# Patient Record
Sex: Female | Born: 1990 | Race: Black or African American | Hispanic: No | Marital: Single | Smoking: Current some day smoker
Health system: Southern US, Community
[De-identification: ages and names within clinical notes are randomized; demographics above are authoritative.]

## PROBLEM LIST (undated history)

## (undated) ENCOUNTER — Inpatient Hospital Stay (HOSPITAL_COMMUNITY): Payer: Self-pay

## (undated) ENCOUNTER — Ambulatory Visit

## (undated) DIAGNOSIS — Z789 Other specified health status: Secondary | ICD-10-CM

## (undated) HISTORY — DX: Other specified health status: Z78.9

## (undated) HISTORY — PX: NO PAST SURGERIES: SHX2092

---
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
## 2012-02-06 ENCOUNTER — Inpatient Hospital Stay (HOSPITAL_COMMUNITY)
Admission: AD | Admit: 2012-02-06 | Discharge: 2012-02-06 | Disposition: A | Discharge status: Home / Self Care | Payer: Self-pay | Source: Ambulatory Visit | Attending: Obstetrics and Gynecology | Admitting: Obstetrics and Gynecology

## 2012-02-06 ENCOUNTER — Encounter (HOSPITAL_COMMUNITY): Payer: Self-pay

## 2012-02-06 DIAGNOSIS — O99891 Other specified diseases and conditions complicating pregnancy: Secondary | ICD-10-CM | POA: Insufficient documentation

## 2012-02-06 DIAGNOSIS — O210 Mild hyperemesis gravidarum: Secondary | ICD-10-CM | POA: Insufficient documentation

## 2012-02-06 DIAGNOSIS — H539 Unspecified visual disturbance: Secondary | ICD-10-CM | POA: Insufficient documentation

## 2012-02-06 DIAGNOSIS — O0930 Supervision of pregnancy with insufficient antenatal care, unspecified trimester: Secondary | ICD-10-CM | POA: Insufficient documentation

## 2012-02-06 DIAGNOSIS — R42 Dizziness and giddiness: Secondary | ICD-10-CM | POA: Insufficient documentation

## 2012-02-06 LAB — CBC
MCH: 31.3 pg (ref 26.0–34.0)
MCHC: 33.9 g/dL (ref 30.0–36.0)
MCV: 92.2 fL (ref 78.0–100.0)
Platelets: 196 10*3/uL (ref 150–400)
RBC: 3.58 MIL/uL — ABNORMAL LOW (ref 3.87–5.11)
RDW: 13.4 % (ref 11.5–15.5)

## 2012-02-06 LAB — URINALYSIS, ROUTINE W REFLEX MICROSCOPIC
Bilirubin Urine: NEGATIVE
Hgb urine dipstick: NEGATIVE
Ketones, ur: 15 mg/dL — AB
Protein, ur: NEGATIVE mg/dL
Urobilinogen, UA: 0.2 mg/dL (ref 0.0–1.0)

## 2012-02-06 MED ORDER — CONCEPT OB 130-92.4-1 MG PO CAPS: 1.0000 | ORAL_CAPSULE | Freq: Every day | ORAL | Status: DC

## 2012-02-06 NOTE — MAU Provider Note (Signed)
Chief Complaint: Dizziness   First Provider Initiated Contact with Patient 02/06/12 1201     SUBJECTIVE HPI: Tracy Church is a 21 y.o. G1P0 at [redacted]w[redacted]d by LMP who presents with dizziness and seeing black spots, x 1 week and occassional heartburn at HS. No Sx now. Mild N/V this pregnancy. Has not started [facility]. Medicaid pending. Denies VB, abd pain, vaginal discharge, palpitations. Tolerating POs.   Past Medical History  Diagnosis Date  . Heart murmur   . Urinary tract infection    OB History    Grav Para Term Preterm Abortions TAB SAB Ect Mult Living   1              # Outc Date GA Lbr Len/2nd Wgt Sex Del Anes PTL Lv   1 CUR              Past Surgical History  Procedure Date  . No past surgeries    History   Social History  . Marital Status: Single    Spouse Name: N/A    Number of Children: N/A  . Years of Education: N/A   Occupational History  . Not on file.   Social History Main Topics  . Smoking status: Former Games developer  . Smokeless tobacco: Never Used     Comment: prior to preg  . Alcohol Use: No  . Drug Use: No  . Sexually Active: Not Currently   Other Topics Concern  . Not on file   Social History Narrative  . No narrative on file   No current facility-administered medications on file prior to encounter.   No current outpatient prescriptions on file prior to encounter.   No Known Allergies  ROS: Pertinent items in HPI  OBJECTIVE Blood pressure 115/53, pulse 81, temperature 98.1 F (36.7 C), temperature source Oral, resp. rate 16, height 5\' 7"  (1.702 m), weight 57.153 kg (126 lb), last menstrual period 09/29/2011. GENERAL: Well-developed, well-nourished female in no acute distress. No cyanosis. Ambulating w/out difficulty.  HEENT: Normocephalic HEART: normal rate RESP: normal effort ABDOMEN: Soft, non-tender EXTREMITIES: Nontender, no edema NEURO: Alert and oriented SPECULUM EXAM: NEFG, Deferred FHR: 150  LAB RESULTS Results for orders  placed during the hospital encounter of 02/06/12 (from the past 24 hour(s))  URINALYSIS, ROUTINE W REFLEX MICROSCOPIC     Status: Abnormal   Collection Time   02/06/12 10:55 AM      Component Value Range   Color, Urine YELLOW  YELLOW   APPearance CLEAR  CLEAR   Specific Gravity, Urine 1.020  1.005 - 1.030   pH 7.0  5.0 - 8.0   Glucose, UA NEGATIVE  NEGATIVE mg/dL   Hgb urine dipstick NEGATIVE  NEGATIVE   Bilirubin Urine NEGATIVE  NEGATIVE   Ketones, ur 15 (*) NEGATIVE mg/dL   Protein, ur NEGATIVE  NEGATIVE mg/dL   Urobilinogen, UA 0.2  0.0 - 1.0 mg/dL   Nitrite NEGATIVE  NEGATIVE   Leukocytes, UA NEGATIVE  NEGATIVE  GLUCOSE, CAPILLARY     Status: Normal   Collection Time   02/06/12 12:18 PM      Component Value Range   Glucose-Capillary 93  70 - 99 mg/dL   Comment 1 Notify RN    CBC     Status: Abnormal   Collection Time   02/06/12 12:40 PM      Component Value Range   WBC 7.3  4.0 - 10.5 K/uL   RBC 3.58 (*) 3.87 - 5.11 MIL/uL   Hemoglobin 11.2 (*)  12.0 - 15.0 g/dL   HCT 33.2 (*) 95.1 - 88.4 %   MCV 92.2  78.0 - 100.0 fL   MCH 31.3  26.0 - 34.0 pg   MCHC 33.9  30.0 - 36.0 g/dL   RDW 16.6  06.3 - 01.6 %   Platelets 196  150 - 400 K/uL    IMAGING No results found.  MAU COURSE No dizziness during MAU visit.   ASSESSMENT 1. Dizziness   2. Insufficient prenatal care    PLAN Discharge home Encouraged small, frequent snacks and increasing fluids.     Follow-up Information    Follow up with [facility]. (will call you to schedule prenatal appointment)    Contact information:   [address] [place] [address] [phone]      Follow up with [facility]ERN[facility] ADMISS[facility]. (As needed if symptoms worsen)    Contact information:   [address] [id number] mc [place] [address] [phone]          Medication List     As of 02/06/2012  6:54 PM    STOP taking these medications          prenatal multivitamin Tabs      TAKE these medications         CONCEPT OB 130-92.4-1 MG Caps   Take 1 tablet by mouth daily.        [place], CNM 02/06/2012  11:57 AM

## 2012-02-06 NOTE — MAU Note (Signed)
Has been feeling dizzy spotting on 09/29/11 had morning sickness earlier none at present, positive pregnancy test in [facility].

## 2012-02-06 NOTE — MAU Note (Signed)
Feeling dizzy and seeing black spots, started about a wk ago.  Sometimes feels chest pain- heartburn,usually before she goes to sleep, none now.

## 2012-02-07 ENCOUNTER — Encounter: Payer: Self-pay | Admitting: Obstetrics & Gynecology

## 2012-02-08 NOTE — MAU Provider Note (Signed)
Attestation of Attending Supervision of Advanced Practitioner (CNM/NP): Evaluation and management procedures were performed by the Advanced Practitioner under my supervision and collaboration.  I have reviewed the Advanced Practitioner's note and chart, and I agree with the management and plan.  Mareta Chesnut 02/08/2012 8:53 AM

## 2012-02-12 ENCOUNTER — Ambulatory Visit (HOSPITAL_COMMUNITY)
Admission: RE | Admit: 2012-02-12 | Discharge: 2012-02-12 | Disposition: A | Discharge status: Home / Self Care | Payer: Self-pay | Source: Ambulatory Visit | Attending: Advanced Practice Midwife | Admitting: Advanced Practice Midwife

## 2012-02-12 DIAGNOSIS — Z1389 Encounter for screening for other disorder: Secondary | ICD-10-CM | POA: Insufficient documentation

## 2012-02-12 DIAGNOSIS — O0930 Supervision of pregnancy with insufficient antenatal care, unspecified trimester: Secondary | ICD-10-CM | POA: Insufficient documentation

## 2012-02-12 DIAGNOSIS — Z363 Encounter for antenatal screening for malformations: Secondary | ICD-10-CM | POA: Insufficient documentation

## 2012-02-12 DIAGNOSIS — O358XX0 Maternal care for other (suspected) fetal abnormality and damage, not applicable or unspecified: Secondary | ICD-10-CM | POA: Insufficient documentation

## 2012-02-14 ENCOUNTER — Encounter (HOSPITAL_COMMUNITY): Payer: Self-pay | Admitting: Advanced Practice Midwife

## 2012-02-14 ENCOUNTER — Encounter: Payer: Self-pay | Admitting: Advanced Practice Midwife

## 2012-02-14 DIAGNOSIS — O4440 Low lying placenta NOS or without hemorrhage, unspecified trimester: Secondary | ICD-10-CM | POA: Insufficient documentation

## 2012-02-14 DIAGNOSIS — O43899 Other placental disorders, unspecified trimester: Secondary | ICD-10-CM | POA: Insufficient documentation

## 2012-03-11 ENCOUNTER — Other Ambulatory Visit: Payer: Self-pay | Admitting: Obstetrics & Gynecology

## 2012-03-11 ENCOUNTER — Ambulatory Visit (INDEPENDENT_AMBULATORY_CARE_PROVIDER_SITE_OTHER): Payer: Self-pay | Admitting: Obstetrics & Gynecology

## 2012-03-11 ENCOUNTER — Encounter: Payer: Self-pay | Admitting: Obstetrics & Gynecology

## 2012-03-11 VITALS — BP 119/64 | Temp 97.2°F | Wt 130.6 lb

## 2012-03-11 DIAGNOSIS — O4440 Low lying placenta NOS or without hemorrhage, unspecified trimester: Secondary | ICD-10-CM

## 2012-03-11 DIAGNOSIS — O0930 Supervision of pregnancy with insufficient antenatal care, unspecified trimester: Secondary | ICD-10-CM

## 2012-03-11 DIAGNOSIS — O4400 Placenta previa specified as without hemorrhage, unspecified trimester: Secondary | ICD-10-CM

## 2012-03-11 DIAGNOSIS — O4410 Placenta previa with hemorrhage, unspecified trimester: Secondary | ICD-10-CM

## 2012-03-11 DIAGNOSIS — O43899 Other placental disorders, unspecified trimester: Secondary | ICD-10-CM

## 2012-03-11 DIAGNOSIS — O99891 Other specified diseases and conditions complicating pregnancy: Secondary | ICD-10-CM

## 2012-03-11 LAB — POCT URINALYSIS DIP (DEVICE)
Bilirubin Urine: NEGATIVE
Hgb urine dipstick: NEGATIVE
Nitrite: NEGATIVE
Protein, ur: 30 mg/dL — AB
pH: 6 (ref 5.0–8.0)

## 2012-03-11 NOTE — Patient Instructions (Addendum)
Circumvallate Placenta  Significance Circumvallate placenta is when the bag of membranes are behind the placenta, limiting the effectiveness of the placenta. The bag of membranes, known as a ring, limits the expansion of the fetal blood vessels. Pregnant women diagnosed with circumvallate placenta have a placenta that is curving inward. The curving of the placenta can cause stress to the fetus, growth restriction and sometimes detachment of the placenta resulting in emergency delivery. Intrauterine growth restriction is a condition where a fetus is unable to grow to its genetically determined size. IUGR refers to a fetus that is expected to be at the 10th percentile or lower when born. When a woman is diagnosed with a circumvallate placenta, this correlates directly to a fetus with IUGR. To ensure the safety and proper growth of an unborn baby, it's important to discuss family history and schedule regular exams. Identification Circumvallate placenta is identified as a thick, round white and opaque ring of membranes around the placenta. As a woman reaches her third trimester in pregnancy, the white ring can begin to cover the fetal side of the placenta. To an untrained eye, circumvallate placenta looks like a plastic white bag forming around the placenta coming up from underneath. Function The placenta is an organ within a woman's body that connects the developing fetus to the uterine wall. The placenta allows the waste of the fetus to be disposed through the mother's kidneys. When a woman is diagnosed with circumvallate placenta, the placenta is unable to provide the fetus with the appropriate amount of oxygen and food. Warning Pregnant women who experience circumvallate placenta are at a very high risk to deliver a premature baby or miscarriage. When circumvallate placentas continue to curve and curl throughout most of the pregnancy there is a chance that the placenta will detach and the baby will need to  be delivered immediately by Caesarean section. If the placenta detaches before 25 weeks of gestation, there is a significant chance of miscarriage. Once a woman is diagnosed with circumvallate placenta, it's essential to have ultrasounds and fetal stress tests as instructed by her obstetrician and specialists Prevention/Solution Diet is important, and a patient with a fetus diagnosed with growth restriction should eat a healthy diet. While the placenta may only release small portions of nutrients, it's important that what is released is healthy. While there are no treatments for circumvallate placenta, most women do carry their fetus to a healthy gestational age and deliver healthy babies. Women who have circumvallate placenta should have monthly ultrasounds and will be treated as high-risk pregnancy patients to ensure the fetus and placenta are closely monitored. To help with the baby's growth and development, prenatal vitamins are essential for the mother-to-be and should be taken twice a day.   Placenta Previa Placenta previa is a condition in which the placenta has grown low in the womb (uterus). This is a condition in which the organ which connects the fetus to the mother's uterus (placenta) is low in the opening in the uterus (cervix). It can partially or completely cover the cervix. The cause of this is unknown. It is more common with multiple births or twins. SYMPTOMS  The main symptom or sign of placenta previa is vaginal bleeding. The bleeding can be mild to very heavy. This condition can be very serious for the mother and baby. Often there are no symptoms with placenta previa. Sometimes if the location of the placenta is very low it will become partially detached and cause bleeding. This may be simply  a marginal sinus separation of the placenta. This is a separation of the vessels from the wall of the uterus. This may cause no further problems other than mild anxiety. There is an increase risk of  intrauterine growth restriction (IUGR) with placenta previa because of the abnormal placement of the placenta. DIAGNOSIS  The diagnosis is usually made by ultrasound exam of the uterus. There may be a careful vaginal exam to see the cervix. The patient will be prepared for a Cesarean section immediately if necessary. TREATMENT  Treatment for placenta previa is usually bed rest in the hospital or at home. You may be given medication to stop contractions. Contractions can increase bleeding. Your doctor may take fluid from the baby's sac (amniocentesis) to see if the baby's lungs are mature enough for a C-section. A blood transfusion may be necessary if you have a low blood count. No further treatment may be needed when placenta previa is present in small degrees. Early placenta previa may resolve on it's own. The placenta moves higher in the birth canal as pregnancy progresses. In this case the placenta no longer is an obstruction to birth. The position of the placenta may need to be reconfirmed during pregnancy. This can be done with an ultrasound exam of the belly(abdomen). Call your caregiver immediately if blood loss is severe. Immediate fluid or blood replacement may be necessary. With complete placenta previa, the only way to safely deliver the baby is by Cesarean section. HOME CARE INSTRUCTIONS   Follow your caregiver's advice about bed rest.  Take any iron pills or other medications your doctor gives to you.  No bending or lifting.  Do not have sexual intercourse.  Do not put anything in your vagina (tampons or vaginal creams). If you are bleeding, use sanitary pads.  Keep your doctors appointments as scheduled. Not keeping the appointment could result in a chronic or permanent injury, pain, disability and injury or death to you or your unborn baby. If there is any problem keeping the appointment, you must call back to this facility for assistance. SEEK IMMEDIATE MEDICAL CARE IF:   You have  increased bleeding.  You have fainting episodes or feel lightheaded.  You develop abdominal pain.  You can no longer feel normal fetal or baby movements.  You develop uterine contractions. Document Released: 02/19/2005 Document Revised: 05/14/2011 Document Reviewed: 10/03/2007 [facility] Patient Information 2013 [place], [place].

## 2012-03-11 NOTE — Progress Notes (Signed)
  Subjective:    Tracy Church is a G1P0 [redacted]w[redacted]d being seen today for her first obstetrical visit.  Was seen in the MAU at [redacted] weeks GA.  She had a detailed ultrasound which was suboptimal for fetal anatomy evaluation given her gestational age, but showed a circumvallate, low-lying anterior placenta 1.2 cm from the internal os.  Patient denies any bleeding since she found out she was pregnant, no other current concerns.  Filed Vitals:   03/11/12 0807  BP: 119/64  Temp: 97.2 F (36.2 C)  Weight: 130 lb 9.6 oz (59.24 kg)    HISTORY: OB History    Grav Para Term Preterm Abortions TAB SAB Ect Mult Living   1              # Outc Date GA Lbr Len/2nd Wgt Sex Del Anes PTL Lv   1 CUR              No past medical history on file. Past Surgical History  Procedure Date  . No past surgeries    Family History  Problem Relation Age of Onset  . Adopted: Yes    Exam   Uterus:  Fundal Height: 20 cm  FHR: 160 bpm  Pelvic Exam: Deferred  System: Breast:  normal appearance, no masses or tenderness   Skin: normal coloration and turgor, no rashes    Neurologic: normal   Extremities: normal strength, tone, and muscle mass   HEENT PERRLA   Mouth/Teeth mucous membranes moist, pharynx normal without lesions and dental hygiene good   Neck supple and no masses   Cardiovascular: regular rate and rhythm   Respiratory:  chest clear, no wheezing, crepitations, rhonchi, normal symmetric air entry   Abdomen: soft, non-tender; bowel sounds normal; no masses,  no organomegaly     Assessment:    Pregnancy: G1P0 Patient Active Problem List  Diagnosis  . Low lying placenta without hemorrhage, antepartum  . Circumvallatte placenta, antepartum  . Late prenatal care, first visit at [redacted]w[redacted]d  Basic information given to patient about significance of her low-lying and circumvallate placenta   Plan:  Initial labs drawn including quad screen. She will continue prenatal vitamins. Problem list reviewed  and updated. Declines flu and TDap vaccines for now MFM referral made for repeat ultrasound and plan of care given her circumvallate placenta Return to [facility] in 2 weeks; bleeding precautions reviewed.  [name], MD, FACOG Attending Obstetrician & Gynecologist Faculty Practice, [facility]

## 2012-03-11 NOTE — Progress Notes (Signed)
Pulse- 85  Pain-"sharp pain in stomach" Pt c/o sharp pain on left hip and radiates down to the knee New ob packet given Weight gain of 25-35lbs Received tdap and flu vaccine info.

## 2012-03-12 ENCOUNTER — Encounter: Payer: Self-pay | Admitting: *Deleted

## 2012-03-13 ENCOUNTER — Telehealth: Payer: Self-pay | Admitting: General Practice

## 2012-03-13 ENCOUNTER — Ambulatory Visit (HOSPITAL_COMMUNITY)
Admission: RE | Admit: 2012-03-13 | Discharge: 2012-03-13 | Disposition: A | Discharge status: Home / Self Care | Payer: Self-pay | Source: Ambulatory Visit | Attending: Obstetrics & Gynecology | Admitting: Obstetrics & Gynecology

## 2012-03-13 ENCOUNTER — Ambulatory Visit (HOSPITAL_COMMUNITY): Payer: Self-pay

## 2012-03-13 ENCOUNTER — Other Ambulatory Visit: Payer: Self-pay | Admitting: Obstetrics & Gynecology

## 2012-03-13 VITALS — BP 106/53 | HR 68 | Wt 130.5 lb

## 2012-03-13 DIAGNOSIS — O43899 Other placental disorders, unspecified trimester: Secondary | ICD-10-CM

## 2012-03-13 DIAGNOSIS — O4440 Low lying placenta NOS or without hemorrhage, unspecified trimester: Secondary | ICD-10-CM

## 2012-03-13 DIAGNOSIS — O26879 Cervical shortening, unspecified trimester: Secondary | ICD-10-CM

## 2012-03-13 DIAGNOSIS — O0930 Supervision of pregnancy with insufficient antenatal care, unspecified trimester: Secondary | ICD-10-CM

## 2012-03-13 DIAGNOSIS — Z3689 Encounter for other specified antenatal screening: Secondary | ICD-10-CM | POA: Insufficient documentation

## 2012-03-13 LAB — CULTURE, OB URINE: Colony Count: 30000

## 2012-03-13 LAB — OBSTETRIC PANEL
Antibody Screen: NEGATIVE
Basophils Relative: 0 % (ref 0–1)
Eosinophils Absolute: 0.1 10*3/uL (ref 0.0–0.7)
Hemoglobin: 10.9 g/dL — ABNORMAL LOW (ref 12.0–15.0)
MCH: 31.7 pg (ref 26.0–34.0)
MCHC: 33.7 g/dL (ref 30.0–36.0)
Monocytes Absolute: 0.7 10*3/uL (ref 0.1–1.0)
Monocytes Relative: 9 % (ref 3–12)
Neutrophils Relative %: 71 % (ref 43–77)
Rh Type: POSITIVE

## 2012-03-13 LAB — HEMOGLOBINOPATHY EVALUATION
Hemoglobin Other: 0 %
Hgb S Quant: 0 %

## 2012-03-13 MED ORDER — NITROFURANTOIN MONOHYD MACRO 100 MG PO CAPS: 100.0000 mg | ORAL_CAPSULE | Freq: Two times a day (BID) | ORAL | Status: DC

## 2012-03-13 NOTE — Progress Notes (Signed)
Urine culture showed 30K of E.coli, treated with Macrobid.

## 2012-03-13 NOTE — Progress Notes (Signed)
[facility]  Indication: 22 yr old G1P0 at [redacted]w[redacted]d for fetal anatomic survey.  Findings: 1. Single intrauterine pregnancy. 2. Fetal biometry is consistent with dating. 3. Anterior placenta that is circumvallate; there is no evidence of previa. 4. Normal amniotic fluid volume. 5. Cervical length is shortened to 1.1cm on vaginal ultrasound. There is funneling of the internal cervical os seen. There is debris seen in the lower uterine segment. 6. The views of the heart and nose/lips are limited. 7. The remainder of the limited anatomy survey is normal.  Recommendations: 1. Appropriate fetal growth. 2. Limited anatomy survey: - will complete anatomic survey on follow up ultrasound 3. Short cervix: - see consult letter 4. Follow up in 5 days to reevaluate cervical length 5. Circumvallate placenta: discussed increased risk of bleeding in pregnancy  Eulis Foster, MD

## 2012-03-13 NOTE — Consult Note (Signed)
MFM consult  22 yr old G1P0 at [redacted]w[redacted]d referred by Dr. Lynetta Mare for fetal anatomic survey and consult.  No significant past medical history  Patient reports no complications in this pregnancy. No cramping, lof, or vb.  Ultrasound today shows:  fetal biometry is consistent with dating. Anterior placenta that is circumvallate; there is no evidence of previa. Normal amniotic fluid volume.  Cervical length is shortened to 1.1cm on vaginal ultrasound. There is funneling of the internal cervical os seen. There is debris seen in the lower uterine segment. The views of the heart and nose/lips are limited. The remainder of the limited anatomy survey is normal.    I discussed the findings of the ultrasound with the patient and counseled her as follows:  1. Appropriate fetal growth.  2. Limited anatomy survey:  - will complete anatomic survey on follow up ultrasound  3. Short cervix:  - discussed increased risk of preterm labor, PPROM, and preterm or previable delivery - discussed it is difficult to predict if and when a patient will deliver but discussed risk of preterm/previable delivery is significantly increased with short cervix - patient is asymptomatic - discussed options for decreasing risk of preterm delivery/PPROM include vaginal progesterone or cervical cerclage - discussed vaginal progesterone has been shown to decrease risk of preterm delivery in women with short cervix; there are no known adverse effects but long term data is limited - discussed risks of cerclage including bleeding, infection, risk of PPROM and risk of pregnancy loss; discussed that cerclage has been shown to decrease risk of preterm delivery in women with short cervix as well - after counseling patient has opted for vaginal progesterone- patient has medicaid and was given a prescription for prometrium 200mg  per vagina qhs - patient will return in 5 days to reevaluate cervical length; if worse at that time patient will  consider cerclage - patient had GC/CT done on 03/11/12 which was negative - preterm labor/PPROM precautions given - discussed would recommend NICU consult around viability and would recommend betamethasone course at the time patient would want full intervention - discussed she may warrant inpatient management around time of viability if cervical length continues to shorten 4. Follow up in 5 days to reevaluate cervical length  5. Circumvallate placenta: discussed increased risk of bleeding in pregnancy  Above discussed with Dr. Penne Lash  I spent 30 minutes in face to face consultation with the patient in addition to time spent on the ultrasound.  Eulis Foster, MD

## 2012-03-13 NOTE — Telephone Encounter (Signed)
Called patient and informed her of UTI from most recent urine culture and that macrobid was available for pick up at [facility] on [address]. Patient verbalized understanding and had no further questions

## 2012-03-13 NOTE — Telephone Encounter (Signed)
Message copied by Kathee Delton on Thu Mar 13, 2012  2:18 PM ------      Message from: [name] A      Created: Thu Mar 13, 2012  2:09 PM       Macrobid called in to treat bacteria in her urine. Please call to inform patient of results and tell her to pick up prescription.

## 2012-03-13 NOTE — Addendum Note (Signed)
Addended by: [name] A on: 03/13/2012 02:09 PM   Modules accepted: Orders

## 2012-03-15 ENCOUNTER — Encounter: Payer: Self-pay | Admitting: Obstetrics & Gynecology

## 2012-03-15 DIAGNOSIS — O26879 Cervical shortening, unspecified trimester: Secondary | ICD-10-CM | POA: Insufficient documentation

## 2012-03-17 ENCOUNTER — Ambulatory Visit (HOSPITAL_COMMUNITY): Payer: Self-pay

## 2012-03-18 ENCOUNTER — Ambulatory Visit (HOSPITAL_COMMUNITY): Payer: Self-pay

## 2012-03-19 ENCOUNTER — Ambulatory Visit (HOSPITAL_COMMUNITY): Payer: Self-pay

## 2012-03-19 ENCOUNTER — Ambulatory Visit (HOSPITAL_COMMUNITY)
Admission: RE | Admit: 2012-03-19 | Discharge: 2012-03-19 | Disposition: A | Discharge status: Home / Self Care | Payer: Self-pay | Source: Ambulatory Visit | Attending: Obstetrics & Gynecology | Admitting: Obstetrics & Gynecology

## 2012-03-19 DIAGNOSIS — O43899 Other placental disorders, unspecified trimester: Secondary | ICD-10-CM

## 2012-03-19 DIAGNOSIS — O26879 Cervical shortening, unspecified trimester: Secondary | ICD-10-CM | POA: Insufficient documentation

## 2012-03-19 NOTE — Progress Notes (Signed)
Tracy Church  was seen today for an ultrasound appointment.  See full report in [facility].  Impression: Single IUP at 21 3/7 weeks Ultrasound for cervical length only TVUS - cervical length of 1.8 cm.  No funneling or dynamic changes  Recommendations: Continue modified bedrest and vaginal progesterone supplementation Weekly cervical lengths until [redacted] weeks gestation; if stable at that time, every other week cervical lengths until [redacted] weeks gestation  Alpha Gula, MD

## 2012-03-24 ENCOUNTER — Encounter: Payer: Self-pay | Admitting: Obstetrics & Gynecology

## 2012-03-24 ENCOUNTER — Encounter: Payer: Self-pay | Admitting: *Deleted

## 2012-03-26 ENCOUNTER — Ambulatory Visit (HOSPITAL_COMMUNITY)
Admission: RE | Admit: 2012-03-26 | Discharge: 2012-03-26 | Disposition: A | Discharge status: Home / Self Care | Payer: Self-pay | Source: Ambulatory Visit | Attending: Obstetrics & Gynecology | Admitting: Obstetrics & Gynecology

## 2012-03-26 VITALS — BP 108/61 | HR 80 | Wt 133.2 lb

## 2012-03-26 DIAGNOSIS — O43899 Other placental disorders, unspecified trimester: Secondary | ICD-10-CM

## 2012-03-26 DIAGNOSIS — O0930 Supervision of pregnancy with insufficient antenatal care, unspecified trimester: Secondary | ICD-10-CM

## 2012-03-26 DIAGNOSIS — O26879 Cervical shortening, unspecified trimester: Secondary | ICD-10-CM | POA: Insufficient documentation

## 2012-03-26 DIAGNOSIS — Z3689 Encounter for other specified antenatal screening: Secondary | ICD-10-CM | POA: Insufficient documentation

## 2012-03-26 NOTE — Progress Notes (Signed)
Tracy Church  was seen today for an ultrasound appointment.  See full report in [facility].  Impression: Single IUP at 22 3/7 weeks  Limited ultrasound performed for cervical length only  TVUS - cervical length 2.2 cm without funneling or dynamic changes  Recommendations: Continue modified bedrest and vaginal progesterone supplementation Weekly cervical lengths until [redacted] weeks gestation; if stable at that time, every other week cervical lengths until [redacted] weeks gestation  Alpha Gula, MD

## 2012-03-27 ENCOUNTER — Encounter: Payer: Self-pay | Admitting: Family Medicine

## 2012-03-27 ENCOUNTER — Ambulatory Visit (INDEPENDENT_AMBULATORY_CARE_PROVIDER_SITE_OTHER): Payer: Self-pay | Admitting: Obstetrics & Gynecology

## 2012-03-27 VITALS — BP 107/67 | Temp 96.6°F | Wt 130.5 lb

## 2012-03-27 DIAGNOSIS — O26879 Cervical shortening, unspecified trimester: Secondary | ICD-10-CM

## 2012-03-27 LAB — POCT URINALYSIS DIP (DEVICE)
Ketones, ur: NEGATIVE mg/dL
Nitrite: NEGATIVE
Specific Gravity, Urine: >=1.03 (ref 1.005–1.030)
Urobilinogen, UA: 0.2 mg/dL (ref 0.0–1.0)

## 2012-03-27 NOTE — Patient Instructions (Signed)
Breastfeeding Deciding to breastfeed is one of the best choices you can make for you and your baby. The information that follows gives a brief overview of the benefits of breastfeeding as well as common topics surrounding breastfeeding. BENEFITS OF BREASTFEEDING For the baby  The first milk (colostrum) helps the baby's digestive system function better.   There are antibodies in the mother's milk that help the baby fight off infections.   The baby has a lower incidence of asthma, allergies, and sudden infant death syndrome (SIDS).   The nutrients in breast milk are better for the baby than infant formulas, and breast milk helps the baby's brain grow better.   Babies who breastfeed have less gas, colic, and constipation.  For the mother  Breastfeeding helps develop a very special bond between the mother and her baby.   Breastfeeding is convenient, always available at the correct temperature, and costs nothing.   Breastfeeding burns calories in the mother and helps her lose weight that was gained during pregnancy.   Breastfeeding makes the uterus contract back down to normal size faster and slows bleeding following delivery.   Breastfeeding mothers have a lower risk of developing breast cancer.  BREASTFEEDING FREQUENCY  A healthy, full-term baby may breastfeed as often as every hour or space his or her feedings to every 3 hours.   Watch your baby for signs of hunger. Nurse your baby if he or she shows signs of hunger. How often you nurse will vary from baby to baby.   Nurse as often as the baby requests, or when you feel the need to reduce the fullness of your breasts.   Awaken the baby if it has been 3 4 hours since the last feeding.   Frequent feeding will help the mother make more milk and will help prevent problems, such as sore nipples and engorgement of the breasts.  BABY'S POSITION AT THE BREAST  Whether lying down or sitting, be sure that the baby's tummy is  facing your tummy.   Support the breast with 4 fingers underneath the breast and the thumb above. Make sure your fingers are well away from the nipple and baby's mouth.   Stroke the baby's lips gently with your finger or nipple.   When the baby's mouth is open wide enough, place all of your nipple and as much of the areola as possible into your baby's mouth.   Pull the baby in close so the tip of the nose and the baby's cheeks touch the breast during the feeding.  FEEDINGS AND SUCTION  The length of each feeding varies from baby to baby and from feeding to feeding.   The baby must suck about 2 3 minutes for your milk to get to him or her. This is called a "let down." For this reason, allow the baby to feed on each breast as long as he or she wants. Your baby will end the feeding when he or she has received the right balance of nutrients.   To break the suction, put your finger into the corner of the baby's mouth and slide it between his or her gums before removing your breast from his or her mouth. This will help prevent sore nipples.  HOW TO TELL WHETHER YOUR BABY IS GETTING ENOUGH BREAST MILK. Wondering whether or not your baby is getting enough milk is a common concern among mothers. You can be assured that your baby is getting enough milk if:   Your baby is actively   sucking and you hear swallowing.   Your baby seems relaxed and satisfied after a feeding.   Your baby nurses at least 8 12 times in a 24 hour time period. Nurse your baby until he or she unlatches or falls asleep at the first breast (at least 10 20 minutes), then offer the second side.   Your baby is wetting 5 6 disposable diapers (6 8 cloth diapers) in a 24 hour period by 5 6 days of age.   Your baby is having at least 3 4 stools every 24 hours for the first 6 weeks. The stool should be soft and yellow.   Your baby should gain 4 7 ounces per week after he or she is 4 days old.   Your breasts feel softer  after nursing.  REDUCING BREAST ENGORGEMENT  In the first week after your baby is born, you may experience signs of breast engorgement. When breasts are engorged, they feel heavy, warm, full, and may be tender to the touch. You can reduce engorgement if you:   Nurse frequently, every 2 3 hours. Mothers who breastfeed early and often have fewer problems with engorgement.   Place light ice packs on your breasts for 10 20 minutes between feedings. This reduces swelling. Wrap the ice packs in a lightweight towel to protect your skin. Bags of frozen vegetables work well for this purpose.   Take a warm shower or apply warm, moist heat to your breast for 5 10 minutes just before each feeding. This increases circulation and helps the milk flow.   Gently massage your breast before and during the feeding. Using your finger tips, massage from the chest wall towards your nipple in a circular motion.   Make sure that the baby empties at least one breast at every feeding before switching sides.   Use a breast pump to empty the breasts if your baby is sleepy or not nursing well. You may also want to pump if you are returning to work oryou feel you are getting engorged.   Avoid bottle feeds, pacifiers, or supplemental feedings of water or juice in place of breastfeeding. Breast milk is all the food your baby needs. It is not necessary for your baby to have water or formula. In fact, to help your breasts make more milk, it is best not to give your baby supplemental feedings during the early weeks.   Be sure the baby is latched on and positioned properly while breastfeeding.   Wear a supportive bra, avoiding underwire styles.   Eat a balanced diet with enough fluids.   Rest often, relax, and take your prenatal vitamins to prevent fatigue, stress, and anemia.  If you follow these suggestions, your engorgement should improve in 24 48 hours. If you are still experiencing difficulty, call your  lactation consultant or caregiver.  CARING FOR YOURSELF Take care of your breasts  Bathe or shower daily.   Avoid using soap on your nipples.   Start feedings on your left breast at one feeding and on your right breast at the next feeding.   You will notice an increase in your milk supply 2 5 days after delivery. You may feel some discomfort from engorgement, which makes your breasts very firm and often tender. Engorgement "peaks" out within 24 48 hours. In the meantime, apply warm moist towels to your breasts for 5 10 minutes before feeding. Gentle massage and expression of some milk before feeding will soften your breasts, making it easier for your   baby to latch on.   Wear a well-fitting nursing bra, and air dry your nipples for a 3 4minutes after each feeding.   Only use cotton bra pads.   Only use pure lanolin on your nipples after nursing. You do not need to wash it off before feeding the baby again. Another option is to express a few drops of breast milk and gently massage it into your nipples.  Take care of yourself  Eat well-balanced meals and nutritious snacks.   Drinking milk, fruit juice, and water to satisfy your thirst (about 8 glasses a day).   Get plenty of rest.  Avoid foods that you notice affect the baby in a bad way.  SEEK MEDICAL CARE IF:   You have difficulty with breastfeeding and need help.   You have a hard, red, sore area on your breast that is accompanied by a fever.   Your baby is too sleepy to eat well or is having trouble sleeping.   Your baby is wetting less than 6 diapers a day, by 5 days of age.   Your baby's skin or white part of his or her eyes is more yellow than it was in the hospital.   You feel depressed.  Document Released: 02/19/2005 Document Revised: 08/21/2011 Document Reviewed: 05/20/2011 [employer] Patient Information 2013 [employer]. Contraception Choices Contraception (birth control) is the use of any methods  or devices to prevent pregnancy. Below are some methods to help avoid pregnancy. HORMONAL METHODS   Contraceptive implant. This is a thin, plastic tube containing progesterone hormone. It does not contain estrogen hormone. Your caregiver inserts the tube in the inner part of the upper arm. The tube can remain in place for up to 3 years. After 3 years, the implant must be removed. The implant prevents the ovaries from releasing an egg (ovulation), thickens the cervical mucus which prevents sperm from entering the uterus, and thins the lining of the inside of the uterus.  Progesterone-only injections. These injections are given every 3 months by your caregiver to prevent pregnancy. This synthetic progesterone hormone stops the ovaries from releasing eggs. It also thickens cervical mucus and changes the uterine lining. This makes it harder for sperm to survive in the uterus.  Birth control pills. These pills contain estrogen and progesterone hormone. They work by stopping the egg from forming in the ovary (ovulation). Birth control pills are prescribed by a caregiver.Birth control pills can also be used to treat heavy periods.  Minipill. This type of birth control pill contains only the progesterone hormone. They are taken every day of each month and must be prescribed by your caregiver.  Birth control patch. The patch contains hormones similar to those in birth control pills. It must be changed once a week and is prescribed by a caregiver.  Vaginal ring. The ring contains hormones similar to those in birth control pills. It is left in the vagina for 3 weeks, removed for 1 week, and then a new one is put back in place. The patient must be comfortable inserting and removing the ring from the vagina.A caregiver's prescription is necessary.  Emergency contraception. Emergency contraceptives prevent pregnancy after unprotected sexual intercourse. This pill can be taken right after sex or up to 5 days after  unprotected sex. It is most effective the sooner you take the pills after having sexual intercourse. Emergency contraceptive pills are available without a prescription. Check with your pharmacist. Do not use emergency contraception as your only form of birth   control. BARRIER METHODS   Female condom. This is a thin sheath (latex or rubber) that is worn over the penis during sexual intercourse. It can be used with spermicide to increase effectiveness.  Female condom. This is a soft, loose-fitting sheath that is put into the vagina before sexual intercourse.  Diaphragm. This is a soft, latex, dome-shaped barrier that must be fitted by a caregiver. It is inserted into the vagina, along with a spermicidal jelly. It is inserted before intercourse. The diaphragm should be left in the vagina for 6 to 8 hours after intercourse.  Cervical cap. This is a round, soft, latex or plastic cup that fits over the cervix and must be fitted by a caregiver. The cap can be left in place for up to 48 hours after intercourse.  Sponge. This is a soft, circular piece of polyurethane foam. The sponge has spermicide in it. It is inserted into the vagina after wetting it and before sexual intercourse.  Spermicides. These are chemicals that kill or block sperm from entering the cervix and uterus. They come in the form of creams, jellies, suppositories, foam, or tablets. They do not require a prescription. They are inserted into the vagina with an applicator before having sexual intercourse. The process must be repeated every time you have sexual intercourse. INTRAUTERINE CONTRACEPTION  Intrauterine device (IUD). This is a T-shaped device that is put in a woman's uterus during a menstrual period to prevent pregnancy. There are 2 types:  Copper IUD. This type of IUD is wrapped in copper wire and is placed inside the uterus. Copper makes the uterus and fallopian tubes produce a fluid that kills sperm. It can stay in place for 10  years.  Hormone IUD. This type of IUD contains the hormone progestin (synthetic progesterone). The hormone thickens the cervical mucus and prevents sperm from entering the uterus, and it also thins the uterine lining to prevent implantation of a fertilized egg. The hormone can weaken or kill the sperm that get into the uterus. It can stay in place for 5 years. PERMANENT METHODS OF CONTRACEPTION  Female tubal ligation. This is when the woman's fallopian tubes are surgically sealed, tied, or blocked to prevent the egg from traveling to the uterus.  Female sterilization. This is when the female has the tubes that carry sperm tied off (vasectomy).This blocks sperm from entering the vagina during sexual intercourse. After the procedure, the man can still ejaculate fluid (semen). NATURAL PLANNING METHODS  Natural family planning. This is not having sexual intercourse or using a barrier method (condom, diaphragm, cervical cap) on days the woman could become pregnant.  Calendar method. This is keeping track of the length of each menstrual cycle and identifying when you are fertile.  Ovulation method. This is avoiding sexual intercourse during ovulation.  Symptothermal method. This is avoiding sexual intercourse during ovulation, using a thermometer and ovulation symptoms.  Post-ovulation method. This is timing sexual intercourse after you have ovulated. Regardless of which type or method of contraception you choose, it is important that you use condoms to protect against the transmission of sexually transmitted diseases (STDs). Talk with your caregiver about which form of contraception is most appropriate for you. Document Released: 02/19/2005 Document Revised: 05/14/2011 Document Reviewed: 06/28/2010 [employer] Patient Information 2013 [employer].  

## 2012-03-27 NOTE — Progress Notes (Signed)
Patient states that she has a lot of pain and cramping her in legs, most commonly her right leg.   p=76

## 2012-03-27 NOTE — Progress Notes (Signed)
Nutrition note: consult for wt loss/ 1st nutr visit Pt has lost 9.5# @ [redacted]w[redacted]d. Pt reports eating 2 meals & 4 snacks/d. Pt reports she has a good appetite so is unsure of why she is losing wt. Pt is taking PNV & reports heartburn occ but no N&V. Pt received verbal & written education on general nutrition during pregnancy & tips to gain wt. Encouraged energy dense snacks. Disc tips to decrease heartburn. Disc wt gain goals of 25-35# or 1#/wk. Pt agrees to continue PNV & include a protein source with all meals and snacks & try to eat more often. Pt does not receive WIC but plans to apply. Pt plans to BF. F/u in 2-4 wks Blondell Reveal, MS, RD, LDN

## 2012-03-27 NOTE — Progress Notes (Signed)
TVUS - cervical length 2.2 cm without funneling or dynamic changes  Recommendations per MFM:  Continue modified bedrest and vaginal progesterone supplementation  Weekly cervical lengths until [redacted] weeks gestation; if stable at that time, every other week cervical lengths until [redacted] weeks gestation Nutrition today for weight gain.

## 2012-04-02 ENCOUNTER — Ambulatory Visit (HOSPITAL_COMMUNITY)
Admission: RE | Admit: 2012-04-02 | Discharge: 2012-04-02 | Disposition: A | Discharge status: Home / Self Care | Payer: Self-pay | Source: Ambulatory Visit | Attending: Obstetrics & Gynecology | Admitting: Obstetrics & Gynecology

## 2012-04-02 VITALS — BP 108/67 | HR 81 | Wt 134.0 lb

## 2012-04-02 DIAGNOSIS — O26879 Cervical shortening, unspecified trimester: Secondary | ICD-10-CM | POA: Insufficient documentation

## 2012-04-02 DIAGNOSIS — O0930 Supervision of pregnancy with insufficient antenatal care, unspecified trimester: Secondary | ICD-10-CM

## 2012-04-02 DIAGNOSIS — Z3689 Encounter for other specified antenatal screening: Secondary | ICD-10-CM | POA: Insufficient documentation

## 2012-04-02 DIAGNOSIS — O43899 Other placental disorders, unspecified trimester: Secondary | ICD-10-CM

## 2012-04-02 NOTE — Progress Notes (Signed)
Corda Wimbish-Baker  was seen today for an ultrasound appointment.  See full report in [facility].  Impression: Single IUP at 23 3/7 weeks  Limited ultrasound performed for cervical length only  TVUS - cervical length 1.6 cm without funneling or dynamic changes  Recommendations: Continue modified bedrest and vaginal progesterone supplementation Weekly cervical lengths until [redacted] weeks gestation; if stable at that time, every other week cervical lengths until [redacted] weeks gestation. If cervical length decreased over next week, consider betamethasone for fetal lung maturity at next visit.  Alpha Gula, MD

## 2012-04-09 ENCOUNTER — Ambulatory Visit (HOSPITAL_COMMUNITY)
Admission: RE | Admit: 2012-04-09 | Discharge: 2012-04-09 | Disposition: A | Discharge status: Home / Self Care | Payer: Self-pay | Source: Ambulatory Visit | Attending: Obstetrics & Gynecology | Admitting: Obstetrics & Gynecology

## 2012-04-09 ENCOUNTER — Encounter (HOSPITAL_COMMUNITY): Payer: Self-pay

## 2012-04-09 VITALS — BP 113/57 | HR 87 | Wt 136.8 lb

## 2012-04-09 DIAGNOSIS — O26879 Cervical shortening, unspecified trimester: Secondary | ICD-10-CM

## 2012-04-09 DIAGNOSIS — O0930 Supervision of pregnancy with insufficient antenatal care, unspecified trimester: Secondary | ICD-10-CM

## 2012-04-09 DIAGNOSIS — O43899 Other placental disorders, unspecified trimester: Secondary | ICD-10-CM

## 2012-04-09 NOTE — Progress Notes (Signed)
Tracy Church  was seen today for an ultrasound appointment.  See full report in [facility].  Impression: Single IUP at 24 3/7 weeks Normal interval anatomy Fetal growth is appropriate (35th %tile) Normal amniotic fluid volume  TVUS - cervical length of 2 cm.  No funneling or dynamic changes noted  Recommendations: Continue modified bedrest and vaginal progesterone Follow up  for cervical length in 2 weeks.  Would offer betamethasone series if worsening cervical length noted.  Alpha Gula, MD

## 2012-04-10 ENCOUNTER — Ambulatory Visit (INDEPENDENT_AMBULATORY_CARE_PROVIDER_SITE_OTHER): Payer: Self-pay | Admitting: Family

## 2012-04-10 VITALS — BP 115/62 | Temp 97.4°F | Wt 135.1 lb

## 2012-04-10 DIAGNOSIS — O26879 Cervical shortening, unspecified trimester: Secondary | ICD-10-CM

## 2012-04-10 LAB — POCT URINALYSIS DIP (DEVICE)
Bilirubin Urine: NEGATIVE
Ketones, ur: NEGATIVE mg/dL
Leukocytes, UA: NEGATIVE
Nitrite: NEGATIVE
Protein, ur: NEGATIVE mg/dL

## 2012-04-10 NOTE — Progress Notes (Signed)
Doing well; no questions or concerns; reviewed last cervical length, next [place] scheduled for 2/19.  See Nutrition today.  Continue nightly Prometrium.

## 2012-04-10 NOTE — Progress Notes (Signed)
Nutrition note: f/u visit Pt has lost 4.9# @ [redacted]w[redacted]d but pt has gained 4.6# in 2wks (1#/ wk is recommended).  Pt reports still eating ~2 meals & 4 snacks/d. Pt stated she is eating more protein rich foods. Pt reports still taking PNV and occ N&V. Encouraged pt to continue including protein with all meals & snacks. Disc wt gain goals of 25-35# or 1#/wk & praised pt on progress so far. Pt agrees to continue PNV & eating protein with meals and snacks. F/u in 4-6wks Blondell Reveal, MS, RD, LDN

## 2012-04-22 ENCOUNTER — Encounter: Payer: Self-pay | Admitting: *Deleted

## 2012-04-23 ENCOUNTER — Ambulatory Visit (HOSPITAL_COMMUNITY): Payer: Self-pay

## 2012-04-24 ENCOUNTER — Ambulatory Visit (HOSPITAL_COMMUNITY): Payer: Self-pay | Attending: Obstetrics & Gynecology

## 2012-04-30 ENCOUNTER — Encounter (HOSPITAL_COMMUNITY): Payer: Self-pay

## 2012-04-30 ENCOUNTER — Ambulatory Visit (HOSPITAL_COMMUNITY)
Admission: RE | Admit: 2012-04-30 | Discharge: 2012-04-30 | Disposition: A | Discharge status: Home / Self Care | Payer: Self-pay | Source: Ambulatory Visit | Attending: Obstetrics & Gynecology | Admitting: Obstetrics & Gynecology

## 2012-04-30 VITALS — BP 120/61 | HR 89 | Wt 135.5 lb

## 2012-04-30 DIAGNOSIS — O43899 Other placental disorders, unspecified trimester: Secondary | ICD-10-CM

## 2012-04-30 DIAGNOSIS — O26879 Cervical shortening, unspecified trimester: Secondary | ICD-10-CM

## 2012-05-01 ENCOUNTER — Ambulatory Visit (INDEPENDENT_AMBULATORY_CARE_PROVIDER_SITE_OTHER): Payer: Self-pay | Admitting: Family

## 2012-05-01 ENCOUNTER — Other Ambulatory Visit: Payer: Self-pay | Admitting: Family

## 2012-05-01 VITALS — BP 95/47 | Temp 97.0°F | Wt 136.2 lb

## 2012-05-01 DIAGNOSIS — O0930 Supervision of pregnancy with insufficient antenatal care, unspecified trimester: Secondary | ICD-10-CM

## 2012-05-01 DIAGNOSIS — O43899 Other placental disorders, unspecified trimester: Secondary | ICD-10-CM

## 2012-05-01 DIAGNOSIS — O26879 Cervical shortening, unspecified trimester: Secondary | ICD-10-CM

## 2012-05-01 DIAGNOSIS — O26873 Cervical shortening, third trimester: Secondary | ICD-10-CM

## 2012-05-01 LAB — POCT URINALYSIS DIP (DEVICE)
Bilirubin Urine: NEGATIVE
Ketones, ur: NEGATIVE mg/dL
Nitrite: NEGATIVE
Protein, ur: NEGATIVE mg/dL
pH: 7.5 (ref 5.0–8.0)

## 2012-05-01 NOTE — Progress Notes (Signed)
No questions or concerns; cervical length check yesterday - 1.92, follow-up scheduled 3/12; no bleeding reported by pt.  1 hr obtained today.  Urine results not available at discharge.

## 2012-05-01 NOTE — Progress Notes (Signed)
Pulse: 68

## 2012-05-02 ENCOUNTER — Encounter: Payer: Self-pay | Admitting: Family

## 2012-05-02 LAB — GLUCOSE TOLERANCE, 1 HOUR (50G) W/O FASTING: Glucose, 1 Hour GTT: 107 mg/dL (ref 70–140)

## 2012-05-13 ENCOUNTER — Other Ambulatory Visit: Payer: Self-pay | Admitting: Family Medicine

## 2012-05-13 DIAGNOSIS — O26879 Cervical shortening, unspecified trimester: Secondary | ICD-10-CM

## 2012-05-14 ENCOUNTER — Ambulatory Visit (HOSPITAL_COMMUNITY): Payer: Self-pay

## 2012-05-20 ENCOUNTER — Ambulatory Visit (HOSPITAL_COMMUNITY): Admission: RE | Admit: 2012-05-20 | Payer: Self-pay | Source: Ambulatory Visit

## 2012-05-22 ENCOUNTER — Encounter: Payer: Self-pay | Admitting: Family Medicine

## 2012-09-22 ENCOUNTER — Encounter (HOSPITAL_COMMUNITY): Payer: Self-pay | Admitting: Emergency Medicine

## 2012-09-22 ENCOUNTER — Emergency Department (HOSPITAL_COMMUNITY)
Admission: EM | Admit: 2012-09-22 | Discharge: 2012-09-22 | Disposition: A | Discharge status: Home / Self Care | Payer: Medicaid Other | Attending: Emergency Medicine | Admitting: Emergency Medicine

## 2012-09-22 DIAGNOSIS — Y9389 Activity, other specified: Secondary | ICD-10-CM | POA: Insufficient documentation

## 2012-09-22 DIAGNOSIS — IMO0002 Reserved for concepts with insufficient information to code with codable children: Secondary | ICD-10-CM | POA: Insufficient documentation

## 2012-09-22 DIAGNOSIS — Z87891 Personal history of nicotine dependence: Secondary | ICD-10-CM | POA: Insufficient documentation

## 2012-09-22 DIAGNOSIS — Y92410 Unspecified street and highway as the place of occurrence of the external cause: Secondary | ICD-10-CM | POA: Insufficient documentation

## 2012-09-22 DIAGNOSIS — T148XXA Other injury of unspecified body region, initial encounter: Secondary | ICD-10-CM

## 2012-09-22 MED ORDER — IBUPROFEN 800 MG PO TABS: 800.0000 mg | ORAL_TABLET | Freq: Three times a day (TID) | ORAL | Status: DC

## 2012-09-22 MED ORDER — CYCLOBENZAPRINE HCL 10 MG PO TABS: 10.0000 mg | ORAL_TABLET | Freq: Two times a day (BID) | ORAL | Status: DC | PRN
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
## 2012-09-23 NOTE — ED Provider Notes (Signed)
Medical screening examination/treatment/procedure(s) were performed by non-physician practitioner and as supervising physician I was immediately available for consultation/collaboration.   Joeli Fenner III, MD 09/23/12 1257 

## 2013-02-11 ENCOUNTER — Inpatient Hospital Stay (HOSPITAL_COMMUNITY)
Admission: AD | Admit: 2013-02-11 | Discharge: 2013-02-11 | Disposition: A | Discharge status: Home / Self Care | Payer: Medicaid Other | Source: Ambulatory Visit | Attending: Obstetrics and Gynecology | Admitting: Obstetrics and Gynecology

## 2013-02-11 ENCOUNTER — Encounter (HOSPITAL_COMMUNITY): Payer: Self-pay | Admitting: *Deleted

## 2013-02-11 DIAGNOSIS — R109 Unspecified abdominal pain: Secondary | ICD-10-CM | POA: Insufficient documentation

## 2013-02-11 DIAGNOSIS — N926 Irregular menstruation, unspecified: Secondary | ICD-10-CM

## 2013-02-11 DIAGNOSIS — Z87891 Personal history of nicotine dependence: Secondary | ICD-10-CM | POA: Insufficient documentation

## 2013-02-11 LAB — CBC
MCH: 30.5 pg (ref 26.0–34.0)
MCHC: 33.7 g/dL (ref 30.0–36.0)
MCV: 90.6 fL (ref 78.0–100.0)
Platelets: 220 10*3/uL (ref 150–400)
RDW: 12.5 % (ref 11.5–15.5)

## 2013-02-11 LAB — WET PREP, GENITAL
Trich, Wet Prep: NONE SEEN
Yeast Wet Prep HPF POC: NONE SEEN

## 2013-02-11 LAB — URINALYSIS, ROUTINE W REFLEX MICROSCOPIC
Bilirubin Urine: NEGATIVE
Ketones, ur: 15 mg/dL — AB
Leukocytes, UA: NEGATIVE
Nitrite: NEGATIVE
Urobilinogen, UA: 0.2 mg/dL (ref 0.0–1.0)
pH: 5.5 (ref 5.0–8.0)

## 2013-02-11 LAB — URINE MICROSCOPIC-ADD ON

## 2013-02-11 NOTE — MAU Provider Note (Signed)
History     CSN: [id number]  Arrival date and time: 02/11/13 1235   First Provider Initiated Contact with Patient 02/11/13 1453      Chief Complaint  Patient presents with  . Abdominal Pain   HPI Ms. Tracy Church is a 22 y.o. G2P0101 who presents to MAU today with complaint of abdominal cramping x 2 days. The patient states that it comes and goes and is only in the lower abdomen. The patient states that she started having vaginal bleeding on Sunday. It has been off and on heavier and lighter since then. She occasionally feels weak. She denies other vaginal discharge, dizziness, fever, UTI symptoms or feeling lightheaded. She had SVD ~ 8 months ago and is still breast feeding. She is not on anything for birth control at this time.   OB History   Grav Para Term Preterm Abortions TAB SAB Ect Mult Living   2 1  1      1       Past Medical History  Diagnosis Date  . Preterm delivery     Past Surgical History  Procedure Laterality Date  . No past surgeries      Family History  Problem Relation Age of Onset  . Adopted: Yes    History  Substance Use Topics  . Smoking status: Former Games developer  . Smokeless tobacco: Never Used     Comment: prior to preg  . Alcohol Use: No    Allergies: No Known Allergies  No prescriptions prior to admission    Review of Systems  Constitutional: Negative for fever and malaise/fatigue.  Gastrointestinal: Positive for abdominal pain. Negative for nausea, vomiting, diarrhea and constipation.  Genitourinary: Negative for dysuria, urgency, frequency and hematuria.       + vaginal bleeding Neg - vaginal discharge  Neurological: Positive for weakness. Negative for dizziness and loss of consciousness.   Physical Exam   Blood pressure 115/85, pulse 78, temperature 98 F (36.7 C), temperature source Oral, resp. rate 18, last menstrual period 02/08/2013.  Physical Exam  Constitutional: She is oriented to person, place, and time. She  appears well-developed and well-nourished. No distress.  HENT:  Head: Normocephalic and atraumatic.  Cardiovascular: Normal rate, regular rhythm and normal heart sounds.   Respiratory: Effort normal and breath sounds normal. No respiratory distress.  GI: Soft. Bowel sounds are normal. She exhibits no distension and no mass. There is tenderness (very mild tenderness to palpation of the lower abdomen bilaterally). There is no rebound and no guarding.  Genitourinary: Uterus is not enlarged and not tender. Cervix exhibits no motion tenderness, no discharge and no friability. Right adnexum displays no mass and no tenderness. Left adnexum displays no mass and no tenderness. There is bleeding (small amount of blood noted in the vagina) around the vagina. No vaginal discharge found.  Neurological: She is alert and oriented to person, place, and time.  Skin: Skin is warm and dry. No erythema.  Psychiatric: She has a normal mood and affect.   Results for orders placed during the hospital encounter of 02/11/13 (from the past 24 hour(s))  URINALYSIS, ROUTINE W REFLEX MICROSCOPIC     Status: Abnormal   Collection Time    02/11/13  1:50 PM      Result Value Range   Color, Urine YELLOW  YELLOW   APPearance CLEAR  CLEAR   Specific Gravity, Urine >1.030 (*) 1.005 - 1.030   pH 5.5  5.0 - 8.0   Glucose, UA NEGATIVE  NEGATIVE  mg/dL   Hgb urine dipstick MODERATE (*) NEGATIVE   Bilirubin Urine NEGATIVE  NEGATIVE   Ketones, ur 15 (*) NEGATIVE mg/dL   Protein, ur NEGATIVE  NEGATIVE mg/dL   Urobilinogen, UA 0.2  0.0 - 1.0 mg/dL   Nitrite NEGATIVE  NEGATIVE   Leukocytes, UA NEGATIVE  NEGATIVE  URINE MICROSCOPIC-ADD ON     Status: Abnormal   Collection Time    02/11/13  1:50 PM      Result Value Range   Squamous Epithelial / LPF FEW (*) RARE   WBC, UA 0-2  <3 WBC/hpf   RBC / HPF 0-2  <3 RBC/hpf   Bacteria, UA FEW (*) RARE   Urine-Other MUCOUS PRESENT    POCT PREGNANCY, URINE     Status: None   Collection  Time    02/11/13  1:57 PM      Result Value Range   Preg Test, Ur NEGATIVE  NEGATIVE  WET PREP, GENITAL     Status: Abnormal   Collection Time    02/11/13  3:05 PM      Result Value Range   Yeast Wet Prep HPF POC NONE SEEN  NONE SEEN   Trich, Wet Prep NONE SEEN  NONE SEEN   Clue Cells Wet Prep HPF POC RARE (*) NONE SEEN   WBC, Wet Prep HPF POC RARE (*) NONE SEEN  CBC     Status: Abnormal   Collection Time    02/11/13  3:08 PM      Result Value Range   WBC 4.6  4.0 - 10.5 K/uL   RBC 3.83 (*) 3.87 - 5.11 MIL/uL   Hemoglobin 11.7 (*) 12.0 - 15.0 g/dL   HCT 16.1 (*) 09.6 - 04.5 %   MCV 90.6  78.0 - 100.0 fL   MCH 30.5  26.0 - 34.0 pg   MCHC 33.7  30.0 - 36.0 g/dL   RDW 40.9  81.1 - 91.4 %   Platelets 220  150 - 400 K/uL    MAU Course  Procedures None  MDM UPT - negative UA, wet prep, GC/CHlamydia and CBC today Patient states that she was on OCPs at one point and did not like the way they made her feel, she would like information on other birth control options today Assessment and Plan  A: Irregular menses while breast feeding  P: Discharge home Bleeding precautions discussed Patient referred to [facility] for GYN care and birth control counseling AVS contains information on birth control options Patient advised to use condoms for birth control in the meantime.  Patient may return to MAU as needed or if her condition were to change or worsen  Freddi Starr, PA-C  02/11/2013, 3:30 PM

## 2013-02-11 NOTE — MAU Note (Signed)
Having abd pain, started a couple days ago.  Has been having irreg bleeding.  Will have a regular cycle, then stop, then start bleeding again.  2nd  Time it has happened since delivery 8 months ago.

## 2013-02-12 LAB — GC/CHLAMYDIA PROBE AMP: CT Probe RNA: NEGATIVE

## 2013-02-12 NOTE — MAU Provider Note (Signed)
Attestation of Attending Supervision of Advanced Practitioner (CNM/NP): Evaluation and management procedures were performed by the Advanced Practitioner under my supervision and collaboration.  I have reviewed the Advanced Practitioner's note and chart, and I agree with the management and plan.  Dontea Corlew 02/12/2013 10:31 AM   

## 2013-05-11 ENCOUNTER — Encounter (HOSPITAL_COMMUNITY): Payer: Self-pay | Admitting: Emergency Medicine

## 2013-05-11 ENCOUNTER — Emergency Department (INDEPENDENT_AMBULATORY_CARE_PROVIDER_SITE_OTHER)
Admission: EM | Admit: 2013-05-11 | Discharge: 2013-05-11 | Disposition: A | Discharge status: Home / Self Care | Payer: Self-pay | Source: Home / Self Care | Attending: Family Medicine | Admitting: Family Medicine

## 2013-05-11 DIAGNOSIS — H109 Unspecified conjunctivitis: Secondary | ICD-10-CM

## 2013-05-11 MED ORDER — POLYMYXIN B-TRIMETHOPRIM 10000-0.1 UNIT/ML-% OP SOLN: 1.0000 [drp] | Freq: Four times a day (QID) | OPHTHALMIC | Status: DC

## 2013-05-11 NOTE — ED Provider Notes (Signed)
CSN: [id number]     Arrival date & time 05/11/13  1636 History   First MD Initiated Contact with Patient 05/11/13 1814     Chief Complaint  Patient presents with  . Conjunctivitis   (Consider location/radiation/quality/duration/timing/severity/associated sxs/prior Treatment) HPI Comments: Left eye redness with moderate amount of yellow crusted/matted discharge in lashes. No contact lens use. No vision changes, eye pain, URI sx, or photophobia. No fever.   Patient is a 23 y.o. female presenting with conjunctivitis. The history is provided by the patient.  Conjunctivitis This is a new problem. Episode onset: 3 days. The problem has not changed since onset.   Past Medical History  Diagnosis Date  . Preterm delivery    Past Surgical History  Procedure Laterality Date  . No past surgeries     Family History  Problem Relation Age of Onset  . Adopted: Yes   History  Substance Use Topics  . Smoking status: Former Games developermoker  . Smokeless tobacco: Never Used     Comment: prior to preg  . Alcohol Use: No   OB History   Grav Para Term Preterm Abortions TAB SAB Ect Mult Living   2 1  1      1      Review of Systems  All other systems reviewed and are negative.    Allergies  Review of patient's allergies indicates no known allergies.  Home Medications   Current Outpatient Rx  Name  Route  Sig  Dispense  Refill  . trimethoprim-polymyxin b (POLYTRIM) ophthalmic solution   Left Eye   Place 1 drop into the left eye every 6 (six) hours. X 7days   10 mL   0    BP 115/60  Pulse 73  Temp(Src) 97.9 F (36.6 C) (Oral)  Resp 18  SpO2 100%  LMP 04/13/2013 Physical Exam  Nursing note and vitals reviewed. Constitutional: She is oriented to person, place, and time. She appears well-developed and well-nourished. No distress.  HENT:  Head: Normocephalic and atraumatic.  Right Ear: External ear normal.  Left Ear: External ear normal.  Nose: Nose normal.  Mouth/Throat: Oropharynx is  clear and moist.  Eyes: EOM are normal. Pupils are equal, round, and reactive to light. Right eye exhibits no discharge, no exudate and no hordeolum. No foreign body present in the right eye. Left eye exhibits discharge and exudate. Left eye exhibits no hordeolum. No foreign body present in the left eye. Right conjunctiva is not injected. Right conjunctiva has no hemorrhage. Left conjunctiva is injected. Left conjunctiva has no hemorrhage.  Slit lamp exam:      The left eye shows no hypopyon and no fluorescein uptake.  Neck: Normal range of motion. Neck supple.  Cardiovascular: Normal rate.   Pulmonary/Chest: Effort normal.  Musculoskeletal: Normal range of motion.  Neurological: She is alert and oriented to person, place, and time.  Skin: Skin is warm and dry. No rash noted.  Psychiatric: She has a normal mood and affect. Her behavior is normal.    ED Course  Procedures (including critical care time) Labs Review Labs Reviewed - No data to display Imaging Review No results found.   MDM   1. Conjunctivitis    Left eye conjunctivitis: Will treat with Polytrim opthalmic solution and advise follow up if no improvement.    Jess BartersJennifer Lee HarmonyvillePresson, [place] 05/11/13 [phone]

## 2013-05-11 NOTE — ED Notes (Signed)
C/o  Bilateral eye redness and drainage x 3 to 4 days with irritation.   Denies fever and any other symptoms.  No relief with otc eye drops.

## 2013-05-11 NOTE — ED Provider Notes (Signed)
Medical screening examination/treatment/procedure(s) were performed by resident physician or non-physician practitioner and as supervising physician I was immediately available for consultation/collaboration.   Ryland Tungate DOUGLAS MD.   Lena Gores D Said Rueb, MD 05/11/13 2033 

## 2013-05-11 NOTE — Discharge Instructions (Signed)

## 2013-12-22 ENCOUNTER — Encounter (HOSPITAL_COMMUNITY): Payer: Self-pay | Admitting: Emergency Medicine

## 2013-12-22 ENCOUNTER — Emergency Department (HOSPITAL_COMMUNITY)
Admission: EM | Admit: 2013-12-22 | Discharge: 2013-12-22 | Disposition: A | Discharge status: Home / Self Care | Payer: Self-pay | Attending: Emergency Medicine | Admitting: Emergency Medicine

## 2013-12-22 DIAGNOSIS — M549 Dorsalgia, unspecified: Secondary | ICD-10-CM | POA: Insufficient documentation

## 2013-12-22 DIAGNOSIS — R1084 Generalized abdominal pain: Secondary | ICD-10-CM | POA: Insufficient documentation

## 2013-12-22 DIAGNOSIS — Z8751 Personal history of pre-term labor: Secondary | ICD-10-CM | POA: Insufficient documentation

## 2013-12-22 DIAGNOSIS — Z87891 Personal history of nicotine dependence: Secondary | ICD-10-CM | POA: Insufficient documentation

## 2013-12-22 DIAGNOSIS — R110 Nausea: Secondary | ICD-10-CM | POA: Insufficient documentation

## 2013-12-22 DIAGNOSIS — Z3202 Encounter for pregnancy test, result negative: Secondary | ICD-10-CM | POA: Insufficient documentation

## 2013-12-22 LAB — URINALYSIS, ROUTINE W REFLEX MICROSCOPIC
Bilirubin Urine: NEGATIVE
Glucose, UA: NEGATIVE mg/dL
Hgb urine dipstick: NEGATIVE
Ketones, ur: NEGATIVE mg/dL
LEUKOCYTES UA: NEGATIVE
Nitrite: NEGATIVE
PROTEIN: NEGATIVE mg/dL
SPECIFIC GRAVITY, URINE: 1.025 (ref 1.005–1.030)
UROBILINOGEN UA: 0.2 mg/dL (ref 0.0–1.0)
pH: 6 (ref 5.0–8.0)

## 2013-12-22 LAB — CBC WITH DIFFERENTIAL/PLATELET
Basophils Absolute: 0 10*3/uL (ref 0.0–0.1)
Basophils Relative: 1 % (ref 0–1)
EOS ABS: 0 10*3/uL (ref 0.0–0.7)
EOS PCT: 1 % (ref 0–5)
HCT: 37.5 % (ref 36.0–46.0)
Hemoglobin: 12 g/dL (ref 12.0–15.0)
LYMPHS ABS: 2.6 10*3/uL (ref 0.7–4.0)
Lymphocytes Relative: 48 % — ABNORMAL HIGH (ref 12–46)
MCH: 29.4 pg (ref 26.0–34.0)
MCHC: 32 g/dL (ref 30.0–36.0)
MCV: 91.9 fL (ref 78.0–100.0)
Monocytes Absolute: 0.5 10*3/uL (ref 0.1–1.0)
Monocytes Relative: 9 % (ref 3–12)
NEUTROS PCT: 41 % — AB (ref 43–77)
Neutro Abs: 2.2 10*3/uL (ref 1.7–7.7)
PLATELETS: 228 10*3/uL (ref 150–400)
RBC: 4.08 MIL/uL (ref 3.87–5.11)
RDW: 13.2 % (ref 11.5–15.5)
WBC: 5.3 10*3/uL (ref 4.0–10.5)

## 2013-12-22 LAB — COMPREHENSIVE METABOLIC PANEL
ALBUMIN: 4.2 g/dL (ref 3.5–5.2)
ALT: 12 U/L (ref 0–35)
ANION GAP: 11 (ref 5–15)
AST: 17 U/L (ref 0–37)
Alkaline Phosphatase: 122 U/L — ABNORMAL HIGH (ref 39–117)
BUN: 15 mg/dL (ref 6–23)
CALCIUM: 9.8 mg/dL (ref 8.4–10.5)
CO2: 25 mEq/L (ref 19–32)
Chloride: 102 mEq/L (ref 96–112)
Creatinine, Ser: 0.76 mg/dL (ref 0.50–1.10)
GFR calc Af Amer: >90 mL/min (ref >90)
GFR calc non Af Amer: >90 mL/min (ref >90)
GLUCOSE: 98 mg/dL (ref 70–99)
Potassium: 4.9 mEq/L (ref 3.7–5.3)
SODIUM: 138 meq/L (ref 137–147)
TOTAL PROTEIN: 8.5 g/dL — AB (ref 6.0–8.3)
Total Bilirubin: <0.2 mg/dL — ABNORMAL LOW (ref 0.3–1.2)

## 2013-12-22 LAB — LIPASE, BLOOD: Lipase: 40 U/L (ref 11–59)

## 2013-12-22 LAB — PREGNANCY, URINE: Preg Test, Ur: NEGATIVE

## 2013-12-22 MED ORDER — ONDANSETRON HCL 4 MG PO TABS: 4.0000 mg | ORAL_TABLET | Freq: Four times a day (QID) | ORAL | Status: DC

## 2013-12-22 NOTE — Discharge Instructions (Signed)
Today your labs were very reassuring. We tested your electrolytes, blood counts, and urine. Please return to ED for new or worsening symptoms.   Abdominal Pain, Women Abdominal (stomach, pelvic, or belly) pain can be caused by many things. It is important to tell your doctor:  The location of the pain.  Does it come and go or is it present all the time?  Are there things that start the pain (eating certain foods, exercise)?  Are there other symptoms associated with the pain (fever, nausea, vomiting, diarrhea)? All of this is helpful to know when trying to find the cause of the pain. CAUSES   Stomach: virus or bacteria infection, or ulcer.  Intestine: appendicitis (inflamed appendix), regional ileitis (Crohn's disease), ulcerative colitis (inflamed colon), irritable bowel syndrome, diverticulitis (inflamed diverticulum of the colon), or cancer of the stomach or intestine.  Gallbladder disease or stones in the gallbladder.  Kidney disease, kidney stones, or infection.  Pancreas infection or cancer.  Fibromyalgia (pain disorder).  Diseases of the female organs:  Uterus: fibroid (non-cancerous) tumors or infection.  Fallopian tubes: infection or tubal pregnancy.  Ovary: cysts or tumors.  Pelvic adhesions (scar tissue).  Endometriosis (uterus lining tissue growing in the pelvis and on the pelvic organs).  Pelvic congestion syndrome (female organs filling up with blood just before the menstrual period).  Pain with the menstrual period.  Pain with ovulation (producing an egg).  Pain with an IUD (intrauterine device, birth control) in the uterus.  Cancer of the female organs.  Functional pain (pain not caused by a disease, may improve without treatment).  Psychological pain.  Depression. DIAGNOSIS  Your doctor will decide the seriousness of your pain by doing an examination.  Blood tests.  X-rays.  Ultrasound.  CT scan (computed tomography, special type of  X-ray).  MRI (magnetic resonance imaging).  Cultures, for infection.  Barium enema (dye inserted in the large intestine, to better view it with X-rays).  Colonoscopy (looking in intestine with a lighted tube).  Laparoscopy (minor surgery, looking in abdomen with a lighted tube).  Major abdominal exploratory surgery (looking in abdomen with a large incision). TREATMENT  The treatment will depend on the cause of the pain.   Many cases can be observed and treated at home.  Over-the-counter medicines recommended by your caregiver.  Prescription medicine.  Antibiotics, for infection.  Birth control pills, for painful periods or for ovulation pain.  Hormone treatment, for endometriosis.  Nerve blocking injections.  Physical therapy.  Antidepressants.  Counseling with a psychologist or psychiatrist.  Minor or major surgery. HOME CARE INSTRUCTIONS   Do not take laxatives, unless directed by your caregiver.  Take over-the-counter pain medicine only if ordered by your caregiver. Do not take aspirin because it can cause an upset stomach or bleeding.  Try a clear liquid diet (broth or water) as ordered by your caregiver. Slowly move to a bland diet, as tolerated, if the pain is related to the stomach or intestine.  Have a thermometer and take your temperature several times a day, and record it.  Bed rest and sleep, if it helps the pain.  Avoid sexual intercourse, if it causes pain.  Avoid stressful situations.  Keep your follow-up appointments and tests, as your caregiver orders.  If the pain does not go away with medicine or surgery, you may try:  Acupuncture.  Relaxation exercises (yoga, meditation).  Group therapy.  Counseling. SEEK MEDICAL CARE IF:   You notice certain foods cause stomach pain.  Your  home care treatment is not helping your pain.  You need stronger pain medicine.  You want your IUD removed.  You feel faint or lightheaded.  You  develop nausea and vomiting.  You develop a rash.  You are having side effects or an allergy to your medicine. SEEK IMMEDIATE MEDICAL CARE IF:   Your pain does not go away or gets worse.  You have a fever.  Your pain is felt only in portions of the abdomen. The right side could possibly be appendicitis. The left lower portion of the abdomen could be colitis or diverticulitis.  You are passing blood in your stools (bright red or black tarry stools, with or without vomiting).  You have blood in your urine.  You develop chills, with or without a fever.  You pass out. MAKE SURE YOU:   Understand these instructions.  Will watch your condition.  Will get help right away if you are not doing well or get worse. Document Released: 12/17/2006 Document Revised: 07/06/2013 Document Reviewed: 01/06/2009 [facility] Patient Information 2015 [place], [place]. This information is not intended to replace advice given to you by your health care provider. Make sure you discuss any questions you have with your health care provider.

## 2013-12-22 NOTE — ED Provider Notes (Signed)
CSN: [id number]     Arrival date & time 12/22/13  1637 History   First MD Initiated Contact with Patient 12/22/13 1900     Chief Complaint  Patient presents with  . Abdominal Pain    cramping  . Nausea    x 4 days. Denies vomiting  . Back Pain    4 day hx of back pain     (Consider location/radiation/quality/duration/timing/severity/associated sxs/prior Treatment) HPI Comments: Patient is a 23 year old female who presents the emergency department today for evaluation of abdominal pain. She describes the pain as cramping. It radiates into her back. She reports it feels like she has a "GI bug". She has had nausea for the past 4 days. She denies any vomiting or diarrhea. No dysuria, urinary urgency, urinary frequency. She was able to tolerate a sandwich one hour ago. She reports that she is currently feeling improved. She denies any suspicious food intake, recent travel, recent camping trips.  Patient is a 23 y.o. female presenting with abdominal pain and back pain. The history is provided by the patient. No language interpreter was used.  Abdominal Pain Associated symptoms: nausea   Associated symptoms: no chest pain, no chills, no diarrhea, no dysuria, no fever, no shortness of breath and no vomiting   Back Pain Associated symptoms: abdominal pain   Associated symptoms: no chest pain, no dysuria and no fever     Past Medical History  Diagnosis Date  . Preterm delivery    Past Surgical History  Procedure Laterality Date  . No past surgeries     Family History  Problem Relation Age of Onset  . Adopted: Yes   History  Substance Use Topics  . Smoking status: Former Games developermoker  . Smokeless tobacco: Never Used     Comment: prior to preg  . Alcohol Use: Yes     Comment: occ   OB History   Grav Para Term Preterm Abortions TAB SAB Ect Mult Living   2 1  1      1      Review of Systems  Constitutional: Negative for fever and chills.  Respiratory: Negative for shortness of  breath.   Cardiovascular: Negative for chest pain.  Gastrointestinal: Positive for nausea and abdominal pain. Negative for vomiting and diarrhea.  Genitourinary: Negative for dysuria, urgency, frequency and difficulty urinating.  Musculoskeletal: Positive for back pain.  All other systems reviewed and are negative.     Allergies  Review of patient's allergies indicates no known allergies.  Home Medications   Prior to Admission medications   Not on File   BP 111/49  Pulse 69  Temp(Src) 98.8 F (37.1 C) (Oral)  Resp 16  Wt 140 lb (63.504 kg)  SpO2 99%  LMP 11/22/2013 Physical Exam  Nursing note and vitals reviewed. Constitutional: She is oriented to person, place, and time. She appears well-developed and well-nourished. No distress.  Very well-appearing  HENT:  Head: Normocephalic and atraumatic.  Right Ear: External ear normal.  Left Ear: External ear normal.  Nose: Nose normal.  Mouth/Throat: Oropharynx is clear and moist.  Eyes: Conjunctivae are normal.  Neck: Normal range of motion.  Cardiovascular: Normal rate, regular rhythm and normal heart sounds.   Pulmonary/Chest: Effort normal and breath sounds normal. No stridor. No respiratory distress. She has no wheezes. She has no rales.  Abdominal: Soft. Bowel sounds are normal. She exhibits no distension. There is no tenderness.  No tenderness to deep palpation  Musculoskeletal: Normal range of motion.  Neurological:  She is alert and oriented to person, place, and time. She has normal strength.  Skin: Skin is warm and dry. She is not diaphoretic. No erythema.  Psychiatric: She has a normal mood and affect. Her behavior is normal.    ED Course  Procedures (including critical care time) Labs Review Labs Reviewed  CBC WITH DIFFERENTIAL - Abnormal; Notable for the following:    Neutrophils Relative % 41 (*)    Lymphocytes Relative 48 (*)    All other components within normal limits  COMPREHENSIVE METABOLIC PANEL -  Abnormal; Notable for the following:    Total Protein 8.5 (*)    Alkaline Phosphatase 122 (*)    Total Bilirubin <0.2 (*)    All other components within normal limits  URINALYSIS, ROUTINE W REFLEX MICROSCOPIC - Abnormal; Notable for the following:    APPearance CLOUDY (*)    All other components within normal limits  PREGNANCY, URINE  LIPASE, BLOOD    Imaging Review No results found.   EKG Interpretation None      MDM   Final diagnoses:  Generalized abdominal pain    Patient is nontoxic, nonseptic appearing, in no apparent distress.  Patient's pain and other symptoms adequately managed in emergency department. Labs and vitals reviewed.  Patient does not meet the SIRS or Sepsis criteria.  On repeat exam patient does not have a surgical abdomen and there are no peritoneal signs.  No indication of appendicitis, bowel obstruction, bowel perforation, cholecystitis, diverticulitis, PID or ectopic pregnancy.  Patient discharged home with symptomatic treatment and given strict instructions for follow-up with their primary care physician.  I have also discussed reasons to return immediately to the ER.  Patient expresses understanding and agrees with plan.     Mora BellmanHannah S Shi Grose, PA-C 12/23/13 0111

## 2013-12-22 NOTE — ED Notes (Signed)
Pt reports that symptoms have resolved since she has been here. She reports over the past several days she had low abdominal pain that has been cramping. Regular periods but can't recall last one.Denies pain or nausea at this time.

## 2013-12-22 NOTE — ED Notes (Signed)
Pt reports 4 day hx of abdominal pain radiating to back. Nausea x 4 days. Denies vomiting, denies UTI symptoms. Ate sandwich one hour ago, denies nausea at present.

## 2013-12-22 NOTE — ED Notes (Signed)
Pt ambulatory upon dc. She reports that she is leaving with ALL belongings she arrived with. DC papers in hand.

## 2013-12-24 NOTE — ED Provider Notes (Signed)
Medical screening examination/treatment/procedure(s) were performed by non-physician practitioner and as supervising physician I was immediately available for consultation/collaboration.   EKG Interpretation None        Audree CamelScott T Alhassan Everingham, MD 12/24/13 1517

## 2014-01-04 ENCOUNTER — Encounter (HOSPITAL_COMMUNITY): Payer: Self-pay | Admitting: Emergency Medicine

## 2014-04-12 ENCOUNTER — Emergency Department (HOSPITAL_BASED_OUTPATIENT_CLINIC_OR_DEPARTMENT_OTHER)
Admission: EM | Admit: 2014-04-12 | Discharge: 2014-04-12 | Disposition: A | Discharge status: Home / Self Care | Payer: Self-pay | Attending: Emergency Medicine | Admitting: Emergency Medicine

## 2014-04-12 ENCOUNTER — Encounter (HOSPITAL_BASED_OUTPATIENT_CLINIC_OR_DEPARTMENT_OTHER): Payer: Self-pay

## 2014-04-12 DIAGNOSIS — R0602 Shortness of breath: Secondary | ICD-10-CM | POA: Insufficient documentation

## 2014-04-12 DIAGNOSIS — N3000 Acute cystitis without hematuria: Secondary | ICD-10-CM | POA: Insufficient documentation

## 2014-04-12 DIAGNOSIS — Z87891 Personal history of nicotine dependence: Secondary | ICD-10-CM | POA: Insufficient documentation

## 2014-04-12 DIAGNOSIS — Z3202 Encounter for pregnancy test, result negative: Secondary | ICD-10-CM | POA: Insufficient documentation

## 2014-04-12 LAB — WET PREP, GENITAL
Trich, Wet Prep: NONE SEEN
YEAST WET PREP: NONE SEEN

## 2014-04-12 LAB — URINALYSIS, ROUTINE W REFLEX MICROSCOPIC
BILIRUBIN URINE: NEGATIVE
Glucose, UA: NEGATIVE mg/dL
KETONES UR: NEGATIVE mg/dL
NITRITE: NEGATIVE
PH: 6.5 (ref 5.0–8.0)
Protein, ur: NEGATIVE mg/dL
Specific Gravity, Urine: 1.026 (ref 1.005–1.030)
UROBILINOGEN UA: 1 mg/dL (ref 0.0–1.0)

## 2014-04-12 LAB — URINE MICROSCOPIC-ADD ON

## 2014-04-12 LAB — PREGNANCY, URINE: PREG TEST UR: NEGATIVE

## 2014-04-12 MED ORDER — HYDROCODONE-ACETAMINOPHEN 5-325 MG PO TABS: 1.0000 | ORAL_TABLET | Freq: Four times a day (QID) | ORAL | Status: DC | PRN

## 2014-04-12 MED ORDER — HYDROMORPHONE HCL 1 MG/ML IJ SOLN
INTRAMUSCULAR | Status: DC
Start: 2014-04-12 — End: 2014-04-12
  Filled 2014-04-12: qty 1

## 2014-04-12 MED ORDER — HYDROMORPHONE HCL 1 MG/ML IJ SOLN: 1.0000 mg | Freq: Once | INTRAMUSCULAR | Status: DC

## 2014-04-12 MED ORDER — ACETAMINOPHEN 500 MG PO TABS: 1000.0000 mg | ORAL_TABLET | Freq: Once | ORAL | Status: DC

## 2014-04-12 MED ORDER — SULFAMETHOXAZOLE-TRIMETHOPRIM 800-160 MG PO TABS: 1.0000 | ORAL_TABLET | Freq: Two times a day (BID) | ORAL | Status: DC

## 2014-04-12 MED ORDER — HYDROCODONE-ACETAMINOPHEN 5-325 MG PO TABS
2.0000 | ORAL_TABLET | Freq: Once | ORAL | Status: AC
  Administered 2014-04-12: 2 via ORAL
  Filled 2014-04-12: qty 2

## 2014-04-12 MED ORDER — SODIUM CHLORIDE 0.9 % IV BOLUS (SEPSIS): 1000.0000 mL | Freq: Once | INTRAVENOUS | Status: DC

## 2014-04-12 NOTE — ED Notes (Signed)
Abd pain x 2 days-irregular pd-denies n/v-minimal diarrhea-denies vaginal d/c

## 2014-04-12 NOTE — ED Notes (Addendum)
MD at bedside. Discussed presence of UTI with pt.  Blood work and CT DC'd at this time.  Will treat UTI and if no improvement pt will pursue blood work and CT at that time.  Will do Pelvic exam and Dc to home.

## 2014-04-12 NOTE — Discharge Instructions (Signed)

## 2014-04-12 NOTE — ED Provider Notes (Signed)
CSN: [id number]     Arrival date & time 04/12/14  1451 History  This chart was scribed for Elwin MochaBlair Brainard Highfill, MD by Abel PrestoKara Demonbreun, ED Scribe. This patient was seen in room MH11/MH11 and the patient's care was started at 3:10 PM.    Chief Complaint  Patient presents with  . Abdominal Pain     Patient is a 24 y.o. female presenting with abdominal pain. The history is provided by the patient. No language interpreter was used.  Abdominal Pain Pain location:  Suprapubic, LLQ and RLQ Pain quality: aching and sharp   Pain radiates to:  Back Pain severity:  Moderate Onset quality:  Gradual Duration:  2 days Timing:  Constant Progression:  Unchanged Context: not sick contacts   Relieved by:  Nothing Worsened by:  Nothing tried Associated symptoms: chest pain (discomfort) and shortness of breath   Associated symptoms: no cough, no diarrhea, no dysuria, no fever, no nausea, no sore throat, no vaginal discharge and no vomiting    HPI Comments: Tracy Church is a 24 y.o. female who presents to the Emergency Department complaining of constant worsening lower abdominal pain with onset 2 days ago. Pt notes pain radiates to her back. She states nothing alleviates or worsens the pain. Pt notes associated headache, SOB, chest discomfort, and fever. Pt LNMP was last week ending 2 days ago. Pt denies Abx use, recent travel, surgeries Pt denies rash, sore throat, nausea, vomiting, vaginal discharge, and dysuria.  Past Medical History  Diagnosis Date  . Preterm delivery    Past Surgical History  Procedure Laterality Date  . No past surgeries     Family History  Problem Relation Age of Onset  . Adopted: Yes   History  Substance Use Topics  . Smoking status: Former Games developermoker  . Smokeless tobacco: Never Used  . Alcohol Use: Yes     Comment: occ   OB History    Gravida Para Term Preterm AB TAB SAB Ectopic Multiple Living   2 1  1      1      Review of Systems  Constitutional: Negative for  fever.  HENT: Negative for sore throat.   Respiratory: Positive for shortness of breath. Negative for cough.   Cardiovascular: Positive for chest pain (discomfort).  Gastrointestinal: Positive for abdominal pain. Negative for nausea, vomiting and diarrhea.  Genitourinary: Negative for dysuria and vaginal discharge.  Musculoskeletal: Positive for back pain.  Skin: Negative for rash.  Neurological: Positive for headaches.  All other systems reviewed and are negative.     Allergies  Review of patient's allergies indicates no known allergies.  Home Medications   Prior to Admission medications   Not on File   BP 122/66 mmHg  Pulse 100  Temp(Src) 100.4 F (38 C) (Oral)  Resp 18  Ht 5\' 6"  (1.676 m)  Wt 149 lb (67.586 kg)  BMI 24.06 kg/m2  SpO2 100%  LMP 04/01/2014 Physical Exam  Constitutional: She is oriented to person, place, and time. She appears well-developed and well-nourished. No distress.  HENT:  Head: Normocephalic and atraumatic.  Eyes: EOM are normal.  Neck: Normal range of motion.  Cardiovascular: Normal rate, regular rhythm and normal heart sounds.   Pulmonary/Chest: Effort normal and breath sounds normal.  Abdominal: Soft. She exhibits no distension. There is tenderness (RLQ, suprapubic, LLQ).  Musculoskeletal: Normal range of motion.  Neurological: She is alert and oriented to person, place, and time.  Skin: Skin is warm and dry. No rash noted.  Psychiatric: She has a normal mood and affect. Judgment normal.  Nursing note and vitals reviewed.   ED Course  Procedures (including critical care time) DIAGNOSTIC STUDIES: Oxygen Saturation is 100% on room air, normal by my interpretation.    COORDINATION OF CARE: 3:17 PM Discussed treatment plan with patient at beside, the patient agrees with the plan and has no further questions at this time.   Labs Review Labs Reviewed  WET PREP, GENITAL - Abnormal; Notable for the following:    Clue Cells Wet Prep HPF  POC FEW (*)    WBC, Wet Prep HPF POC MANY (*)    All other components within normal limits  URINALYSIS, ROUTINE W REFLEX MICROSCOPIC - Abnormal; Notable for the following:    APPearance CLOUDY (*)    Hgb urine dipstick LARGE (*)    Leukocytes, UA LARGE (*)    All other components within normal limits  URINE MICROSCOPIC-ADD ON - Abnormal; Notable for the following:    Bacteria, UA MANY (*)    All other components within normal limits  PREGNANCY, URINE  GC/CHLAMYDIA PROBE AMP ([place])    Imaging Review No results found.   EKG Interpretation None      MDM   Final diagnoses:  Acute cystitis without hematuria   67F here with lower abdominal pain for 2 days. No fevers, N/V, no dysuria. No vaginal discharge. Patient's exam with diffuse lower abdominal pain. RLQ > LLQ. Normal GU exam, no tenderness or vaginal discharge.  UA shows UTI, with large leukocytes, TNTC WBCs and many bacteria. This is likely culprit, I discussed return in 24 hrs for repeat exam if symptoms did not improve. Will give pain meds, given antibiotics.    I personally performed the services described in this documentation, which was scribed in my presence. The recorded information has been reviewed and is accurate.       Elwin Mocha, MD 04/12/14 [phone]

## 2014-04-13 LAB — GC/CHLAMYDIA PROBE AMP (~~LOC~~) NOT AT ARMC
Chlamydia: POSITIVE — AB
NEISSERIA GONORRHEA: POSITIVE — AB

## 2014-04-14 ENCOUNTER — Telehealth (HOSPITAL_BASED_OUTPATIENT_CLINIC_OR_DEPARTMENT_OTHER): Payer: Self-pay | Admitting: Emergency Medicine

## 2014-04-14 NOTE — Telephone Encounter (Signed)
+  GC and + chlamydia, not treated for same, chart handoff to EDP

## 2014-04-15 ENCOUNTER — Telehealth (HOSPITAL_COMMUNITY): Payer: Self-pay

## 2014-04-15 NOTE — ED Notes (Addendum)
Chart returned from EDP office today. Called and informed pt. Advised to abstain from sexual activity x 10 days and to notify partner(s). zithromax 1 gram po and Cefixime 400mg  po x 1 called to [facility] on [address] per pt request. [employer]HS form faxed

## 2014-12-22 ENCOUNTER — Encounter (HOSPITAL_COMMUNITY): Payer: Self-pay | Admitting: *Deleted

## 2014-12-22 ENCOUNTER — Emergency Department (HOSPITAL_COMMUNITY)
Admission: EM | Admit: 2014-12-22 | Discharge: 2014-12-23 | Disposition: A | Discharge status: Home / Self Care | Payer: Self-pay | Attending: Emergency Medicine | Admitting: Emergency Medicine

## 2014-12-22 DIAGNOSIS — Z720 Tobacco use: Secondary | ICD-10-CM | POA: Insufficient documentation

## 2014-12-22 DIAGNOSIS — Z3202 Encounter for pregnancy test, result negative: Secondary | ICD-10-CM | POA: Insufficient documentation

## 2014-12-22 DIAGNOSIS — K529 Noninfective gastroenteritis and colitis, unspecified: Secondary | ICD-10-CM | POA: Insufficient documentation

## 2014-12-22 LAB — I-STAT BETA HCG BLOOD, ED (MC, WL, AP ONLY)

## 2014-12-22 LAB — COMPREHENSIVE METABOLIC PANEL
ALBUMIN: 4.5 g/dL (ref 3.5–5.0)
ALK PHOS: 62 U/L (ref 38–126)
ALT: 21 U/L (ref 14–54)
AST: 27 U/L (ref 15–41)
Anion gap: 8 (ref 5–15)
BUN: 8 mg/dL (ref 6–20)
CALCIUM: 9.4 mg/dL (ref 8.9–10.3)
CHLORIDE: 101 mmol/L (ref 101–111)
CO2: 25 mmol/L (ref 22–32)
CREATININE: 0.7 mg/dL (ref 0.44–1.00)
GFR calc Af Amer: >60 mL/min (ref >60)
GFR calc non Af Amer: >60 mL/min (ref >60)
GLUCOSE: 112 mg/dL — AB (ref 65–99)
Potassium: 3.8 mmol/L (ref 3.5–5.1)
SODIUM: 134 mmol/L — AB (ref 135–145)
Total Bilirubin: 0.9 mg/dL (ref 0.3–1.2)
Total Protein: 8.8 g/dL — ABNORMAL HIGH (ref 6.5–8.1)

## 2014-12-22 LAB — CBC
HCT: 36.1 % (ref 36.0–46.0)
Hemoglobin: 11.7 g/dL — ABNORMAL LOW (ref 12.0–15.0)
MCH: 29 pg (ref 26.0–34.0)
MCHC: 32.4 g/dL (ref 30.0–36.0)
MCV: 89.4 fL (ref 78.0–100.0)
PLATELETS: 222 10*3/uL (ref 150–400)
RBC: 4.04 MIL/uL (ref 3.87–5.11)
RDW: 14.8 % (ref 11.5–15.5)
WBC: 7.2 10*3/uL (ref 4.0–10.5)

## 2014-12-22 LAB — LIPASE, BLOOD: Lipase: 20 U/L — ABNORMAL LOW (ref 22–51)

## 2014-12-22 MED ORDER — SODIUM CHLORIDE 0.9 % IV BOLUS (SEPSIS)
500.0000 mL | Freq: Once | INTRAVENOUS | Status: AC
  Administered 2014-12-23: 500 mL via INTRAVENOUS

## 2014-12-22 MED ORDER — ONDANSETRON HCL 4 MG/2ML IJ SOLN
4.0000 mg | Freq: Once | INTRAMUSCULAR | Status: AC
  Administered 2014-12-23: 4 mg via INTRAVENOUS
  Filled 2014-12-22: qty 2

## 2014-12-22 NOTE — ED Notes (Signed)
Made first request for urine sample pt stated she would provide one ASAP

## 2014-12-22 NOTE — ED Notes (Signed)
Pt states that she began with N/V/D this am and just feels achey and tired; pt states that she has vomited x 2 today and has had diarrhea x 4; pt states that she has been sipping on fluids but feels like she can't eat; pt states "I think I have a stomach virus"

## 2014-12-22 NOTE — ED Notes (Signed)
Patient c/o feeling nauseated today, "a little bit of diarrhea", and vomiting a couple times. Last normal BM 12/21/2014. Patient c/o "aching" to generalized abd. Patient states moving around makes her pain worse.

## 2014-12-22 NOTE — ED Provider Notes (Signed)
CSN: [id number]     Arrival date & time 12/22/14  2051 History   First MD Initiated Contact with Patient 12/22/14 2254     Chief Complaint  Patient presents with  . Emesis  . Diarrhea   HPI  Tracy Church is a 24 year old female presenting with nausea, vomiting and diarrhea. Pt reports onset of symptoms earlier today. She reports constant nausea since this morning with "a couple" episodes of NBNB vomiting. She also endorses multiple episodes of small volume, non-bloody diarrhea today. Last normal BM was yesterday. She states that she has been able to sip water today but has not had an appetite. She reports generalized aching abdominal pain earlier today with increased flatus which has resolved at this time. She is also complaining of "I just don't feel good. I'm tired and just don't feel right".  Denies fevers, chills, diaphoresis, headache, dizziness, syncope, chest pain, SOB, back pain, flank pain, dysuria or numbness/weakness in the extremities.   Past Medical History  Diagnosis Date  . Preterm delivery    Past Surgical History  Procedure Laterality Date  . No past surgeries     Family History  Problem Relation Age of Onset  . Adopted: Yes   Social History  Substance Use Topics  . Smoking status: Current Some Day Smoker  . Smokeless tobacco: Never Used  . Alcohol Use: Yes     Comment: occ   OB History    Gravida Para Term Preterm AB TAB SAB Ectopic Multiple Living   Review of Systems  Constitutional: Negative for fever, chills and diaphoresis.  Respiratory: Negative for shortness of breath.   Cardiovascular: Negative for chest pain.  Gastrointestinal: Positive for nausea, vomiting, abdominal pain and diarrhea.  Genitourinary: Negative for dysuria and flank pain.  Musculoskeletal: Negative for back pain and neck pain.  Neurological: Negative for dizziness, syncope, weakness, numbness and headaches.  All other systems reviewed and are  negative.     Allergies  Review of patient's allergies indicates no known allergies.  Home Medications   Prior to Admission medications   Medication Sig Start Date End Date Taking? Authorizing Provider  HYDROcodone-acetaminophen (NORCO/VICODIN) 5-325 MG per tablet Take 1 tablet by mouth every 6 (six) hours as needed for moderate pain. Patient not taking: Reported on 12/22/2014 04/12/14   Elwin Mocha, MD  naproxen (NAPROSYN) 500 MG tablet Take 1 tablet (500 mg total) by mouth 2 (two) times daily. 12/23/14   Wesly Whisenant, PA-C  ondansetron (ZOFRAN ODT) 4 MG disintegrating tablet Take 1 tablet (4 mg total) by mouth every 8 (eight) hours as needed for nausea or vomiting. 12/23/14   Darcell Sabino, PA-C  sulfamethoxazole-trimethoprim (SEPTRA DS) 800-160 MG per tablet Take 1 tablet by mouth every 12 (twelve) hours. Patient not taking: Reported on 12/22/2014 04/12/14   Elwin Mocha, MD   BP 103/49 mmHg  Pulse 64  Temp(Src) 98.8 F (37.1 C) (Oral)  Resp 17  SpO2 100%  LMP 11/12/2014 Physical Exam  Constitutional: She appears well-developed and well-nourished. No distress.  HENT:  Head: Normocephalic and atraumatic.  Mouth/Throat: Oropharynx is clear and moist.  Moist mucus membranes  Eyes: Conjunctivae are normal. Right eye exhibits no discharge. Left eye exhibits no discharge. No scleral icterus.  Neck: Normal range of motion.  Cardiovascular: Normal rate, regular rhythm and normal heart sounds.   Pulmonary/Chest: Effort normal and breath sounds normal. No respiratory distress. She has  no wheezes. She has no rales.  Abdominal: Soft. Bowel sounds are normal. She exhibits no distension. There is no tenderness. There is no rebound and no guarding.  Musculoskeletal: Normal range of motion.  Neurological: She is alert. Coordination normal.  Skin: Skin is warm and dry.  Psychiatric: She has a normal mood and affect. Her behavior is normal.  Nursing note and vitals reviewed.   ED Course   Procedures (including critical care time) Labs Review Labs Reviewed  LIPASE, BLOOD - Abnormal; Notable for the following:    Lipase 20 (*)    All other components within normal limits  COMPREHENSIVE METABOLIC PANEL - Abnormal; Notable for the following:    Sodium 134 (*)    Glucose, Bld 112 (*)    Total Protein 8.8 (*)    All other components within normal limits  CBC - Abnormal; Notable for the following:    Hemoglobin 11.7 (*)    All other components within normal limits  URINALYSIS, ROUTINE W REFLEX MICROSCOPIC (NOT AT [facility]) - Abnormal; Notable for the following:    Hgb urine dipstick TRACE (*)    All other components within normal limits  URINE MICROSCOPIC-ADD ON  I-STAT BETA HCG BLOOD, ED ([facility], [facility], [facility] ONLY)    Imaging Review No results found. I have personally reviewed and evaluated these images and lab results as part of my medical decision-making.   EKG Interpretation None      MDM   Final diagnoses:  Gastroenteritis   Pt presenting with nausea, vomiting and diarrhea x 1 day. Patient with symptoms consistent with viral gastroenteritis.  Vitals are stable, no fever.  No signs of dehydration, tolerating PO fluids in ED. Abdomen is soft with no focal pain. No rebound or guarding; no peritoneal signs to suggest surgical abdomen. No concern for appendicitis, cholecystitis, pancreatitis, ruptured viscus, UTI, kidney stone, or any other abdominal etiology at this time. Nausea resolved in ED after 500 bolus and zofran. Supportive therapy indicated, will give zofran for home relief. Return precautions given in discharge paperwork and discussed with pt at bedside. Pt stable for discharge      Alveta HeimlichStevi Khylan Sawyer, PA-C 12/23/14 1141  Linwood DibblesJon Knapp, MD 12/30/14 [phone]

## 2014-12-22 NOTE — ED Notes (Signed)
Patient ambulated to restroom unassisted to provide urine sample.

## 2014-12-23 LAB — URINALYSIS, ROUTINE W REFLEX MICROSCOPIC
Bilirubin Urine: NEGATIVE
GLUCOSE, UA: NEGATIVE mg/dL
Ketones, ur: NEGATIVE mg/dL
LEUKOCYTES UA: NEGATIVE
Nitrite: NEGATIVE
PH: 5.5 (ref 5.0–8.0)
Protein, ur: NEGATIVE mg/dL
Specific Gravity, Urine: 1.007 (ref 1.005–1.030)
Urobilinogen, UA: 0.2 mg/dL (ref 0.0–1.0)

## 2014-12-23 LAB — URINE MICROSCOPIC-ADD ON

## 2014-12-23 MED ORDER — NAPROXEN 500 MG PO TABS: 500.0000 mg | ORAL_TABLET | Freq: Two times a day (BID) | ORAL | Status: DC

## 2014-12-23 MED ORDER — ONDANSETRON 4 MG PO TBDP: 4.0000 mg | ORAL_TABLET | Freq: Three times a day (TID) | ORAL | Status: DC | PRN

## 2014-12-23 NOTE — ED Notes (Signed)
IV attempt x2 unsuccessful. Requested 2nd RN to bedside

## 2016-03-28 ENCOUNTER — Encounter (HOSPITAL_COMMUNITY): Payer: Self-pay | Admitting: Family Medicine

## 2016-03-28 ENCOUNTER — Ambulatory Visit (HOSPITAL_COMMUNITY)
Admission: EM | Admit: 2016-03-28 | Discharge: 2016-03-28 | Disposition: A | Discharge status: Home / Self Care | Payer: Medicaid Other | Attending: Internal Medicine | Admitting: Internal Medicine

## 2016-03-28 DIAGNOSIS — R300 Dysuria: Secondary | ICD-10-CM

## 2016-03-28 DIAGNOSIS — N889 Noninflammatory disorder of cervix uteri, unspecified: Secondary | ICD-10-CM

## 2016-03-28 DIAGNOSIS — R350 Frequency of micturition: Secondary | ICD-10-CM | POA: Diagnosis present

## 2016-03-28 DIAGNOSIS — Z113 Encounter for screening for infections with a predominantly sexual mode of transmission: Secondary | ICD-10-CM

## 2016-03-28 LAB — POCT URINALYSIS DIP (DEVICE)
BILIRUBIN URINE: NEGATIVE
GLUCOSE, UA: NEGATIVE mg/dL
KETONES UR: NEGATIVE mg/dL
LEUKOCYTES UA: NEGATIVE
Nitrite: NEGATIVE
Protein, ur: NEGATIVE mg/dL
SPECIFIC GRAVITY, URINE: 1.02 (ref 1.005–1.030)
Urobilinogen, UA: 1 mg/dL (ref 0.0–1.0)
pH: 7 (ref 5.0–8.0)

## 2016-03-28 LAB — POCT PREGNANCY, URINE: PREG TEST UR: NEGATIVE

## 2016-03-28 MED ORDER — CEFTRIAXONE SODIUM 250 MG IJ SOLR
250.0000 mg | Freq: Once | INTRAMUSCULAR | Status: AC
  Administered 2016-03-28: 250 mg via INTRAMUSCULAR

## 2016-03-28 MED ORDER — FLUCONAZOLE 200 MG PO TABS: 200.0000 mg | ORAL_TABLET | Freq: Every day | ORAL | 0 refills | Status: AC

## 2016-03-28 MED ORDER — AZITHROMYCIN 250 MG PO TABS
ORAL_TABLET | ORAL | Status: AC
  Filled 2016-03-28: qty 4

## 2016-03-28 MED ORDER — CEFTRIAXONE SODIUM 250 MG IJ SOLR
INTRAMUSCULAR | Status: AC
  Filled 2016-03-28: qty 250

## 2016-03-28 MED ORDER — AZITHROMYCIN 250 MG PO TABS
1000.0000 mg | ORAL_TABLET | Freq: Once | ORAL | Status: AC
  Administered 2016-03-28: 1000 mg via ORAL

## 2016-03-28 MED ORDER — BENZONATATE 100 MG PO CAPS: 100.0000 mg | ORAL_CAPSULE | Freq: Three times a day (TID) | ORAL | 0 refills | Status: DC

## 2016-03-28 NOTE — ED Provider Notes (Signed)
CSN: [id number]     Arrival date & time 03/28/16  1700 History   None    Chief Complaint  Patient presents with  . Urinary Tract Infection   (Consider location/radiation/quality/duration/timing/severity/associated sxs/prior Treatment) 26 year old female presents to clinic with chief complaint of a cough for 3 days, dry hacking, non-productive. She also complains of urinary frequency and urgency and lower back pain for 1 week. She further requests screening for STDs and HIV. She is sexually active, denies pain with intercourse, she denies pelvic pain, or odor, denies discharge. Denies fever, nausea, or other systemic symptoms.   The history is provided by the patient.  Urinary Tract Infection    Past Medical History:  Diagnosis Date  . Preterm delivery    Past Surgical History:  Procedure Laterality Date  . NO PAST SURGERIES     Family History  Problem Relation Age of Onset  . Adopted: Yes   Social History  Substance Use Topics  . Smoking status: Current Some Day Smoker  . Smokeless tobacco: Never Used  . Alcohol use Yes     Comment: occ   OB History    Gravida Para Term Preterm AB Living   2 1   1   1    SAB TAB Ectopic Multiple Live Births                 Review of Systems  Reason unable to perform ROS: as covered in HPI.  All other systems reviewed and are negative.   Allergies  Patient has no known allergies.  Home Medications   Prior to Admission medications   Medication Sig Start Date End Date Taking? Authorizing Provider  benzonatate (TESSALON) 100 MG capsule Take 1 capsule (100 mg total) by mouth every 8 (eight) hours. 03/28/16   Dorena BodoLawrence Stepen Prins, NP  fluconazole (DIFLUCAN) 200 MG tablet Take 1 tablet (200 mg total) by mouth daily. 03/28/16 04/04/16  Dorena BodoLawrence Gaylen Venning, NP   Meds Ordered and Administered this Visit   Medications  cefTRIAXone (ROCEPHIN) injection 250 mg (not administered)  azithromycin (ZITHROMAX) tablet 1,000 mg (not administered)    BP  124/74   Pulse 82   Temp 98.6 F (37 C)   Resp 18   LMP 03/24/2016   SpO2 100%  No data found.   Physical Exam  Constitutional: She is oriented to person, place, and time. She appears well-developed and well-nourished. No distress.  HENT:  Head: Normocephalic and atraumatic.  Pulmonary/Chest: Effort normal and breath sounds normal. No respiratory distress. She has no wheezes.  Abdominal: Soft. Bowel sounds are normal. She exhibits no distension. There is no tenderness. Hernia confirmed negative in the right inguinal area and confirmed negative in the left inguinal area.  Genitourinary: Uterus normal. Pelvic exam was performed with patient prone. There is no rash, tenderness or lesion on the right labia. There is no rash, tenderness or lesion on the left labia. Cervix exhibits motion tenderness, discharge and friability. Right adnexum displays no tenderness. Left adnexum displays no tenderness. No erythema in the vagina. No foreign body in the vagina. No signs of injury around the vagina. Vaginal discharge found.    Lymphadenopathy:       Right: No inguinal adenopathy present.       Left: No inguinal adenopathy present.  Neurological: She is alert and oriented to person, place, and time.  Skin: Skin is warm and dry. Capillary refill takes less than 2 seconds. She is not diaphoretic.  Psychiatric: She has a  normal mood and affect.  Nursing note and vitals reviewed.   Urgent Care Course     Procedures (including critical care time)  Labs Review Labs Reviewed  POCT URINALYSIS DIP (DEVICE) - Abnormal; Notable for the following:       Result Value   Hgb urine dipstick TRACE (*)    All other components within normal limits  HIV ANTIBODY (ROUTINE TESTING)  RPR  POCT PREGNANCY, URINE  CERVICOVAGINAL ANCILLARY ONLY    Imaging Review No results found.   Visual Acuity Review  Right Eye Distance:   Left Eye Distance:   Bilateral Distance:    Right Eye Near:   Left Eye Near:     Bilateral Near:         MDM   1. Dysuria   2. Screen for STD (sexually transmitted disease)   3. Abnormal appearance of cervix   Based on your urine test, you do not have a UTI and you are not pregnant, based on the findings on exam, I believe it is warranted to treat for infection. You have been given an injection of rocephin here in clinic along with a tablet of Azithromycin. I have also sent a prescription for diflucan to your pharmacy.  Also, on your exam, you had a lesion, a patch of white, raised skin on the surface of the cervix. While this may be a variation of a normal finding, I do not have the ability to make that determination in clinic. I highly recommend your schedule an appointment with a gynecologist within the next two weeks to follow up and rule out other conditions. With regard to your URI symptoms, I have sent a medicine called Tessalon to your pharmacy, take 1 tablet every 8 hours as needed for cough.      Dorena Bodo, NP 03/28/16 2146

## 2016-03-28 NOTE — Discharge Instructions (Signed)
Based on your urine test, you do not have a UTI and you are not pregnant, based on the findings on exam, I believe it is warranted to treat for infection. You have been given an injection of rocephin here in clinic along with a tablet of Azithromycin. I have also sent a prescription for diflucan to your pharmacy.  Also, on your exam, you had a lesion, a patch of white, raised skin on the surface of the cervix. While this may be a variation of a normal finding, I do not have the ability to make that determination in clinic. I highly recommend your schedule an appointment with a gynecologist within the next two weeks to follow up and rule out other conditions. With regard to your URI symptoms, I have sent a medicine called Tessalon to your pharmacy, take 1 tablet every 8 hours as needed for cough.

## 2016-03-28 NOTE — ED Triage Notes (Signed)
Pt here for urinary frequency and cough.

## 2016-03-29 LAB — HIV ANTIBODY (ROUTINE TESTING W REFLEX): HIV Screen 4th Generation wRfx: NONREACTIVE

## 2016-03-29 LAB — CERVICOVAGINAL ANCILLARY ONLY
CHLAMYDIA, DNA PROBE: NEGATIVE
Neisseria Gonorrhea: NEGATIVE

## 2016-03-29 LAB — RPR: RPR: NONREACTIVE

## 2016-03-30 LAB — CERVICOVAGINAL ANCILLARY ONLY: WET PREP (BD AFFIRM): POSITIVE — AB

## 2017-03-20 ENCOUNTER — Emergency Department (HOSPITAL_BASED_OUTPATIENT_CLINIC_OR_DEPARTMENT_OTHER)
Admission: EM | Admit: 2017-03-20 | Discharge: 2017-03-20 | Disposition: A | Discharge status: Home / Self Care | Payer: Self-pay | Attending: Physician Assistant | Admitting: Physician Assistant

## 2017-03-20 ENCOUNTER — Encounter (HOSPITAL_BASED_OUTPATIENT_CLINIC_OR_DEPARTMENT_OTHER): Payer: Self-pay | Admitting: Emergency Medicine

## 2017-03-20 ENCOUNTER — Other Ambulatory Visit: Payer: Self-pay

## 2017-03-20 DIAGNOSIS — R638 Other symptoms and signs concerning food and fluid intake: Secondary | ICD-10-CM | POA: Insufficient documentation

## 2017-03-20 DIAGNOSIS — R110 Nausea: Secondary | ICD-10-CM | POA: Insufficient documentation

## 2017-03-20 DIAGNOSIS — F17200 Nicotine dependence, unspecified, uncomplicated: Secondary | ICD-10-CM | POA: Insufficient documentation

## 2017-03-20 LAB — URINALYSIS, ROUTINE W REFLEX MICROSCOPIC
Bilirubin Urine: NEGATIVE
Glucose, UA: NEGATIVE mg/dL
HGB URINE DIPSTICK: NEGATIVE
KETONES UR: NEGATIVE mg/dL
LEUKOCYTES UA: NEGATIVE
Nitrite: NEGATIVE
PROTEIN: NEGATIVE mg/dL
Specific Gravity, Urine: >1.03 — ABNORMAL HIGH (ref 1.005–1.030)
pH: 6 (ref 5.0–8.0)

## 2017-03-20 LAB — PREGNANCY, URINE: PREG TEST UR: NEGATIVE

## 2017-03-20 MED ORDER — ONDANSETRON 4 MG PO TBDP: 4.0000 mg | ORAL_TABLET | Freq: Three times a day (TID) | ORAL | 0 refills | Status: DC | PRN

## 2017-03-20 NOTE — Discharge Instructions (Signed)
We really want you to follow-up with a primary care physician.  Below are some information about primary care is in the area.  To find a primary care or specialty doctor please call [phone] or [phone] to access "[place] Find a Doctor Service."  You may also go on the [facility] website at [email or web address]  There are also multiple [facility] and [facility] practices throughout the [place] that are frequently accepting new patients. You may find a clinic that is close to your home and contact them.  [facility] -  [address] [place] [phone]  [facility] in [place] (also locations in [place] and [place]) -  [address] [place] [address] [phone]  [facility] -  [address] [place] [address] [phone]

## 2017-03-20 NOTE — ED Triage Notes (Signed)
Pt reports nausea, irregular period and states "I think I have a virus". Symptoms x 2 days.

## 2017-03-20 NOTE — ED Provider Notes (Signed)
[facility] EMERGENCY DEPARTMENT Provider Note   CSN: [id number] Arrival date & time: 03/20/17  0818     History   Chief Complaint Chief Complaint  Patient presents with  . Nausea    HPI Tracy Church is a 27 y.o. female.  HPI   Patient is a 27 year old female presenting here for checkup.  Patient is here with patient's daughter who has a fever.  However patient just wanted to be checked.  She feels like she has been a little bit nauseous.  She is also had a little fluctuation in her weight.  Patient also reports irregular period.  And would like a pregnancy test.  Past Medical History:  Diagnosis Date  . Preterm delivery     Patient Active Problem List   Diagnosis Date Noted  . Short cervix, antepartum 03/15/2012  . Late prenatal care, first visit at 2528w2d 03/11/2012  . Circumvallatte placenta, antepartum 02/14/2012    Past Surgical History:  Procedure Laterality Date  . NO PAST SURGERIES      OB History    Gravida Para Term Preterm AB Living   2 1   1   1    SAB TAB Ectopic Multiple Live Births                   Home Medications    Prior to Admission medications   Medication Sig Start Date End Date Taking? Authorizing Provider  benzonatate (TESSALON) 100 MG capsule Take 1 capsule (100 mg total) by mouth every 8 (eight) hours. 03/28/16   Dorena BodoKennard, Lawrence, NP    Family History Family History  Adopted: Yes    Social History Social History   Tobacco Use  . Smoking status: Current Some Day Smoker  . Smokeless tobacco: Never Used  Substance Use Topics  . Alcohol use: Yes    Comment: occ  . Drug use: Yes    Types: Marijuana     Allergies   Patient has no known allergies.   Review of Systems Review of Systems  Constitutional: Positive for appetite change. Negative for activity change and fever.  Respiratory: Negative for shortness of breath.   Cardiovascular: Negative for chest pain.  Gastrointestinal: Negative for  abdominal pain.  All other systems reviewed and are negative.    Physical Exam Updated Vital Signs BP 117/78 (BP Location: Right Arm)   Pulse 77   Temp 97.8 F (36.6 C) (Oral)   Resp 18   Ht 5\' 6"  (1.676 m)   Wt 59 kg (130 lb)   LMP 03/16/2017   SpO2 100%   BMI 20.98 kg/m   Physical Exam  Constitutional: She is oriented to person, place, and time. She appears well-developed and well-nourished.  HENT:  Head: Normocephalic and atraumatic.  Eyes: Right eye exhibits no discharge. Left eye exhibits no discharge.  Cardiovascular: Normal rate, regular rhythm and normal heart sounds.  No murmur heard. Pulmonary/Chest: Effort normal and breath sounds normal. She has no wheezes. She has no rales.  Neurological: She is oriented to person, place, and time.  Skin: Skin is warm and dry. She is not diaphoretic.  Psychiatric: She has a normal mood and affect.  Nursing note and vitals reviewed.    ED Treatments / Results  Labs (all labs ordered are listed, but only abnormal results are displayed) Labs Reviewed  PREGNANCY, URINE    EKG  EKG Interpretation None       Radiology No results found.  Procedures Procedures (including critical care  time)  Medications Ordered in ED Medications - No data to display   Initial Impression / Assessment and Plan / ED Course  I have reviewed the triage vital signs and the nursing notes.  Pertinent labs & imaging results that were available during my care of the patient were reviewed by me and considered in my medical decision making (see chart for details).     Patient is a 27 year old female presenting here for checkup.  Patient is here with patient's daughter who has a fever.  However patient just wanted to be checked.  She feels like she has been a little bit nauseous.  She is also had a little fluctuation in her weight.  Patient also reports irregular period.  And would like a pregnancy test.  9:00 AM Patient very well-appearing  with normal vital signs and no distinct symptoms.  Will do urine, urine pregnancy.  Will give patient phone number for PCP to follow-up. Final Clinical Impressions(s) / ED Diagnoses   Final diagnoses:  None    ED Discharge Orders    None       Sayvion Vigen, Cindee Salt, MD 03/20/17 0901

## 2017-07-15 ENCOUNTER — Other Ambulatory Visit: Payer: Self-pay

## 2017-07-15 ENCOUNTER — Encounter (HOSPITAL_BASED_OUTPATIENT_CLINIC_OR_DEPARTMENT_OTHER): Payer: Self-pay | Admitting: Emergency Medicine

## 2017-07-15 ENCOUNTER — Emergency Department (HOSPITAL_BASED_OUTPATIENT_CLINIC_OR_DEPARTMENT_OTHER)
Admission: EM | Admit: 2017-07-15 | Discharge: 2017-07-15 | Disposition: A | Discharge status: Home / Self Care | Payer: Self-pay | Attending: Emergency Medicine | Admitting: Emergency Medicine

## 2017-07-15 DIAGNOSIS — Z3A01 Less than 8 weeks gestation of pregnancy: Secondary | ICD-10-CM | POA: Insufficient documentation

## 2017-07-15 DIAGNOSIS — Z3201 Encounter for pregnancy test, result positive: Secondary | ICD-10-CM | POA: Insufficient documentation

## 2017-07-15 LAB — PREGNANCY, URINE: Preg Test, Ur: POSITIVE — AB

## 2017-07-15 NOTE — ED Provider Notes (Signed)
[facility] EMERGENCY DEPARTMENT Provider Note   CSN: [id number] Arrival date & time: 07/15/17  0930     History   Chief Complaint Chief Complaint  Patient presents with  . Possible Pregnancy    HPI Tracy Church is a 27 y.o. female who presents for evaluation of possible pregnancy.  Patient reports that she took a home pregnancy test yesterday and states it is positive and she wanted to get it double checked.  Patient reports that she has been having some nausea and some breast tenderness.  Patient thinks that her last period was in the middle of March.  Patient reports that she has had one previous pregnancy that resulted in preterm birth.  Patient does not currently have an OB GYN.  Patient denies any fevers, difficulty breathing, abdominal pain, vomiting, vaginal discharge, vaginal bleeding.  The history is provided by the patient.    Past Medical History:  Diagnosis Date  . Preterm delivery     Patient Active Problem List   Diagnosis Date Noted  . Short cervix, antepartum 03/15/2012  . Late prenatal care, first visit at [redacted]w[redacted]d 03/11/2012  . Circumvallatte placenta, antepartum 02/14/2012    Past Surgical History:  Procedure Laterality Date  . NO PAST SURGERIES       OB History    Gravida  2   Para  1   Term      Preterm  1   AB      Living  1     SAB      TAB      Ectopic      Multiple      Live Births               Home Medications    Prior to Admission medications   Medication Sig Start Date End Date Taking? Authorizing Provider  benzonatate (TESSALON) 100 MG capsule Take 1 capsule (100 mg total) by mouth every 8 (eight) hours. 03/28/16   Dorena Bodo, NP  ondansetron (ZOFRAN ODT) 4 MG disintegrating tablet Take 1 tablet (4 mg total) by mouth every 8 (eight) hours as needed for nausea or vomiting. 03/20/17   Mackuen, Cindee Salt, MD    Family History Family History  Adopted: Yes    Social History Social  History   Tobacco Use  . Smoking status: Current Some Day Smoker  . Smokeless tobacco: Never Used  Substance Use Topics  . Alcohol use: Yes    Comment: occ  . Drug use: Yes    Types: Marijuana     Allergies   Patient has no known allergies.   Review of Systems Review of Systems  Constitutional: Negative for fever.  Gastrointestinal: Positive for nausea. Negative for abdominal pain and vomiting.  Genitourinary: Negative for vaginal bleeding and vaginal discharge.     Physical Exam Updated Vital Signs BP 136/60 (BP Location: Right Arm)   Pulse 79   Temp 98.5 F (36.9 C) (Oral)   Resp 12   Ht  (1.676 m)   Wt 61.2 kg (135 lb)   LMP 05/15/2017 (Approximate)   SpO2 100%   BMI 21.79 kg/m   Physical Exam  Constitutional: She appears well-developed and well-nourished.  HENT:  Head: Normocephalic and atraumatic.  Eyes: Conjunctivae and EOM are normal. Right eye exhibits no discharge. Left eye exhibits no discharge. No scleral icterus.  Pulmonary/Chest: Effort normal.  Abdominal: Soft. There is no tenderness. There is no CVA tenderness.  Abdomen is soft, non-distended,  non-tender. No rigidity, No guarding. No peritoneal signs.  Neurological: She is alert.  Skin: Skin is warm and dry.  Psychiatric: She has a normal mood and affect. Her speech is normal and behavior is normal.  Nursing note and vitals reviewed.    ED Treatments / Results  Labs (all labs ordered are listed, but only abnormal results are displayed) Labs Reviewed  PREGNANCY, URINE - Abnormal; Notable for the following components:      Result Value   Preg Test, Ur POSITIVE (*)    All other components within normal limits    EKG None  Radiology No results found.  Procedures Procedures (including critical care time)  Medications Ordered in ED Medications - No data to display   Initial Impression / Assessment and Plan / ED Course  I have reviewed the triage vital signs and the nursing  notes.  Pertinent labs & imaging results that were available during my care of the patient were reviewed by me and considered in my medical decision making (see chart for details).     27 year old female who presents for evaluation of possible pregnancy.  Patient reports she took a home pregnancy test that was positive and wanted to get it double checked.  Reports she has been having some nausea breast tenderness.  No abdominal pain, vaginal bleeding, vaginal discharge.  Does not currently have an OB/GYN.  Urine pregnancy ordered at triage.  Urine pregnancy positive.  Discussed results with patient.  Patient does not wish to have an ultrasound here in the ED today.  We will plan to give her outpatient referral to women's health for further evaluation. Patient had ample opportunity for questions and discussion. All patient's questions were answered with full understanding. Strict return precautions discussed. Patient expresses understanding and agreement to plan.   Final Clinical Impressions(s) / ED Diagnoses   Final diagnoses:  Less than [redacted] weeks gestation of pregnancy    ED Discharge Orders    None       Rosana Hoes 07/15/17 1029    Raeford Razor, MD 07/16/17 [phone]

## 2017-07-15 NOTE — Discharge Instructions (Signed)
Follow-up with referred OB/GYN as directed.  Return the emergency department for any vaginal bleeding, vaginal discharge, abdominal pain or any other worsening or concerning symptoms.

## 2017-07-15 NOTE — ED Triage Notes (Signed)
Patient reports positive at home pregnancy test-requesting pregnancy test in ER.

## 2017-08-22 ENCOUNTER — Emergency Department (HOSPITAL_BASED_OUTPATIENT_CLINIC_OR_DEPARTMENT_OTHER)
Admission: EM | Admit: 2017-08-22 | Discharge: 2017-08-22 | Disposition: A | Discharge status: Home / Self Care | Payer: Medicaid Other | Attending: Emergency Medicine | Admitting: Emergency Medicine

## 2017-08-22 ENCOUNTER — Other Ambulatory Visit: Payer: Self-pay

## 2017-08-22 ENCOUNTER — Encounter (HOSPITAL_BASED_OUTPATIENT_CLINIC_OR_DEPARTMENT_OTHER): Payer: Self-pay

## 2017-08-22 DIAGNOSIS — Z3491 Encounter for supervision of normal pregnancy, unspecified, first trimester: Secondary | ICD-10-CM

## 2017-08-22 DIAGNOSIS — Z3A09 9 weeks gestation of pregnancy: Secondary | ICD-10-CM | POA: Insufficient documentation

## 2017-08-22 DIAGNOSIS — O210 Mild hyperemesis gravidarum: Secondary | ICD-10-CM | POA: Insufficient documentation

## 2017-08-22 DIAGNOSIS — E860 Dehydration: Secondary | ICD-10-CM

## 2017-08-22 LAB — URINALYSIS, ROUTINE W REFLEX MICROSCOPIC
BILIRUBIN URINE: NEGATIVE
Glucose, UA: NEGATIVE mg/dL
HGB URINE DIPSTICK: NEGATIVE
Ketones, ur: 15 mg/dL — AB
Leukocytes, UA: NEGATIVE
NITRITE: NEGATIVE
PH: 7 (ref 5.0–8.0)
Protein, ur: NEGATIVE mg/dL
SPECIFIC GRAVITY, URINE: 1.02 (ref 1.005–1.030)

## 2017-08-22 LAB — BASIC METABOLIC PANEL
Anion gap: 8 (ref 5–15)
BUN: 7 mg/dL (ref 6–20)
CHLORIDE: 103 mmol/L (ref 101–111)
CO2: 22 mmol/L (ref 22–32)
Calcium: 8.4 mg/dL — ABNORMAL LOW (ref 8.9–10.3)
Creatinine, Ser: 0.57 mg/dL (ref 0.44–1.00)
GFR calc non Af Amer: >60 mL/min (ref >60)
Glucose, Bld: 82 mg/dL (ref 65–99)
POTASSIUM: 3.4 mmol/L — AB (ref 3.5–5.1)
Sodium: 133 mmol/L — ABNORMAL LOW (ref 135–145)

## 2017-08-22 LAB — CBC
HEMATOCRIT: 34.6 % — AB (ref 36.0–46.0)
HEMOGLOBIN: 12.1 g/dL (ref 12.0–15.0)
MCH: 32.1 pg (ref 26.0–34.0)
MCHC: 35 g/dL (ref 30.0–36.0)
MCV: 91.8 fL (ref 78.0–100.0)
Platelets: 190 10*3/uL (ref 150–400)
RBC: 3.77 MIL/uL — ABNORMAL LOW (ref 3.87–5.11)
RDW: 12.1 % (ref 11.5–15.5)
WBC: 7 10*3/uL (ref 4.0–10.5)

## 2017-08-22 LAB — HCG, QUANTITATIVE, PREGNANCY: hCG, Beta Chain, Quant, S: 130329 m[IU]/mL — ABNORMAL HIGH (ref <5)

## 2017-08-22 MED ORDER — SODIUM CHLORIDE 0.9 % IV BOLUS
1000.0000 mL | Freq: Once | INTRAVENOUS | Status: AC
  Administered 2017-08-22: 1000 mL via INTRAVENOUS

## 2017-08-22 NOTE — ED Notes (Signed)
Pt tolerating ice chips.

## 2017-08-22 NOTE — ED Provider Notes (Signed)
[facility] EMERGENCY DEPARTMENT Provider Note   CSN: [id number] Arrival date & time: 08/22/17  1500     History   Chief Complaint Chief Complaint  Patient presents with  . Emesis    HPI Tracy Church is a 27 y.o. female.  HPI Patient is a 27 year old G2, P1 A0 presents to the emergency department approximately 8 to [redacted] weeks pregnant with ongoing nausea vomiting.  No blood in her vomit.  No fevers or chills.  Reports some occasional mild lower abdominal pain.  No dysuria or urinary frequency.  Feels as though she is losing weight from her nausea and vomiting.  She did not have a lot of vomiting or nausea with her first pregnancy.  She has yet to have an ultrasound.  She is a positive pregnancy test at home and in the emergency department.  She has not had OB follow-up yet.  Her symptoms are mild in severity   Past Medical History:  Diagnosis Date  . Preterm delivery     Patient Active Problem List   Diagnosis Date Noted  . Short cervix, antepartum 03/15/2012  . Late prenatal care, first visit at [redacted]w[redacted]d 03/11/2012  . Circumvallatte placenta, antepartum 02/14/2012    Past Surgical History:  Procedure Laterality Date  . NO PAST SURGERIES       OB History    Gravida  3   Para  1   Term      Preterm  1   AB      Living  1     SAB      TAB      Ectopic      Multiple      Live Births               Home Medications    Prior to Admission medications   Not on File    Family History Family History  Adopted: Yes    Social History Social History   Tobacco Use  . Smoking status: Former Games developer  . Smokeless tobacco: Never Used  Substance Use Topics  . Alcohol use: Not Currently  . Drug use: Not Currently     Allergies   Patient has no known allergies.   Review of Systems Review of Systems  All other systems reviewed and are negative.    Physical Exam Updated Vital Signs BP 112/68 (BP Location: Right Arm)   Pulse  63   Temp 98.4 F (36.9 C) (Oral)   Resp 18   Ht 5\' 6"  (1.676 m)   Wt 56.5 kg (124 lb 9.6 oz)   LMP 05/15/2017 (Approximate)   SpO2 100%   BMI 20.11 kg/m   Physical Exam  Constitutional: She is oriented to person, place, and time. She appears well-developed and well-nourished.  HENT:  Head: Normocephalic.  Eyes: EOM are normal.  Neck: Normal range of motion.  Cardiovascular: Normal rate.  Pulmonary/Chest: Effort normal.  Abdominal: She exhibits no distension and no mass. There is no tenderness. There is no guarding.  Musculoskeletal: Normal range of motion.  Neurological: She is alert and oriented to person, place, and time.  Psychiatric: She has a normal mood and affect.  Nursing note and vitals reviewed.    ED Treatments / Results  Labs (all labs ordered are listed, but only abnormal results are displayed) Labs Reviewed  CBC - Abnormal; Notable for the following components:      Result Value   RBC 3.77 (*)    HCT  34.6 (*)    All other components within normal limits  HCG, QUANTITATIVE, PREGNANCY - Abnormal; Notable for the following components:   hCG, Beta Chain, Quant, S 130,329 (*)    All other components within normal limits  URINALYSIS, ROUTINE W REFLEX MICROSCOPIC - Abnormal; Notable for the following components:   APPearance CLOUDY (*)    Ketones, ur 15 (*)    All other components within normal limits  BASIC METABOLIC PANEL - Abnormal; Notable for the following components:   Sodium 133 (*)    Potassium 3.4 (*)    Calcium 8.4 (*)    All other components within normal limits    EKG None  Radiology No results found.  Procedures Ultrasound ED Pelvic Performed by: Azalia Bilisampos, Ayce Pietrzyk, MD Authorized by: Azalia Bilisampos, Sharmila Wrobleski, MD   Procedure details:    Indications: evaluate for IUP     Assess:  Intrauterine pregnancy and EGA   Technique:  Transabdominal obstetric (HCG+) exam   Images: archived    Uterine findings:    Intrauterine pregnancy: identified      Single gestation: identified     Gestational sac: identified     Yolk sac: identified     Fetal pole: identified     Fetal heart rate: identified     Estimated gestational age: 1.4 weeks Left ovary findings:    Adnexal mass: not identified Right ovary findings:     Adnexal mass: not identified Other findings:    Free pelvic fluid: not identified   Comments:     FHR 173   (including critical care time)  Medications Ordered in ED Medications  sodium chloride 0.9 % bolus 1,000 mL (0 mLs Intravenous Stopped 08/22/17 1647)     Initial Impression / Assessment and Plan / ED Course  I have reviewed the triage vital signs and the nursing notes.  Pertinent labs & imaging results that were available during my care of the patient were reviewed by me and considered in my medical decision making (see chart for details).     Intrauterine pregnancy.  Nausea improved with alcohol swab.  Home with instructions for alcohol swab as needed nausea and ongoing oral hydration.  Recommend over-the-counter Unisom for nausea as well.  She will need obstetrical follow-up.  Bedside ultrasound demonstrates intrauterine pregnancy  Final Clinical Impressions(s) / ED Diagnoses   Final diagnoses:  Acute dehydration  First trimester pregnancy    ED Discharge Orders    None       Azalia Bilisampos, Creg Gilmer, MD 08/22/17 1718

## 2017-08-22 NOTE — Discharge Instructions (Addendum)
[facility]

## 2017-08-22 NOTE — ED Triage Notes (Signed)
C/o n/v/d, feeling lightheaded, HA x 3 days-pt states she is approx [redacted] weeks pregnant-NAD-steady gait

## 2017-09-16 ENCOUNTER — Other Ambulatory Visit: Payer: Self-pay

## 2017-09-16 ENCOUNTER — Emergency Department (HOSPITAL_COMMUNITY): Payer: Medicaid Other

## 2017-09-16 ENCOUNTER — Emergency Department (HOSPITAL_COMMUNITY)
Admission: EM | Admit: 2017-09-16 | Discharge: 2017-09-16 | Disposition: A | Discharge status: Home / Self Care | Payer: Medicaid Other | Attending: Emergency Medicine | Admitting: Emergency Medicine

## 2017-09-16 ENCOUNTER — Encounter (HOSPITAL_COMMUNITY): Payer: Self-pay | Admitting: Emergency Medicine

## 2017-09-16 DIAGNOSIS — O99611 Diseases of the digestive system complicating pregnancy, first trimester: Secondary | ICD-10-CM | POA: Insufficient documentation

## 2017-09-16 DIAGNOSIS — O26851 Spotting complicating pregnancy, first trimester: Secondary | ICD-10-CM | POA: Diagnosis not present

## 2017-09-16 DIAGNOSIS — R12 Heartburn: Secondary | ICD-10-CM | POA: Diagnosis not present

## 2017-09-16 DIAGNOSIS — O210 Mild hyperemesis gravidarum: Secondary | ICD-10-CM

## 2017-09-16 DIAGNOSIS — Z3A12 12 weeks gestation of pregnancy: Secondary | ICD-10-CM | POA: Diagnosis not present

## 2017-09-16 LAB — COMPREHENSIVE METABOLIC PANEL
ALBUMIN: 3.6 g/dL (ref 3.5–5.0)
ALT: 13 U/L (ref 0–44)
ANION GAP: 9 (ref 5–15)
AST: 19 U/L (ref 15–41)
Alkaline Phosphatase: 35 U/L — ABNORMAL LOW (ref 38–126)
BILIRUBIN TOTAL: 0.6 mg/dL (ref 0.3–1.2)
BUN: 6 mg/dL (ref 6–20)
CO2: 23 mmol/L (ref 22–32)
Calcium: 9.5 mg/dL (ref 8.9–10.3)
Chloride: 102 mmol/L (ref 98–111)
Creatinine, Ser: 0.57 mg/dL (ref 0.44–1.00)
GFR calc Af Amer: >60 mL/min (ref >60)
Glucose, Bld: 99 mg/dL (ref 70–99)
POTASSIUM: 3.7 mmol/L (ref 3.5–5.1)
Sodium: 134 mmol/L — ABNORMAL LOW (ref 135–145)
Total Protein: 6.7 g/dL (ref 6.5–8.1)

## 2017-09-16 LAB — URINALYSIS, ROUTINE W REFLEX MICROSCOPIC
Bilirubin Urine: NEGATIVE
Glucose, UA: 50 mg/dL — AB
Hgb urine dipstick: NEGATIVE
Ketones, ur: NEGATIVE mg/dL
LEUKOCYTES UA: NEGATIVE
NITRITE: NEGATIVE
PH: 5 (ref 5.0–8.0)
Protein, ur: NEGATIVE mg/dL
SPECIFIC GRAVITY, URINE: 1.021 (ref 1.005–1.030)

## 2017-09-16 LAB — WET PREP, GENITAL
Sperm: NONE SEEN
Trich, Wet Prep: NONE SEEN
Yeast Wet Prep HPF POC: NONE SEEN

## 2017-09-16 LAB — CBC
HEMATOCRIT: 35.4 % — AB (ref 36.0–46.0)
HEMOGLOBIN: 11.6 g/dL — AB (ref 12.0–15.0)
MCH: 31.3 pg (ref 26.0–34.0)
MCHC: 32.8 g/dL (ref 30.0–36.0)
MCV: 95.4 fL (ref 78.0–100.0)
Platelets: 194 10*3/uL (ref 150–400)
RBC: 3.71 MIL/uL — AB (ref 3.87–5.11)
RDW: 12.2 % (ref 11.5–15.5)
WBC: 7.6 10*3/uL (ref 4.0–10.5)

## 2017-09-16 LAB — LIPASE, BLOOD: Lipase: 37 U/L (ref 11–51)

## 2017-09-16 LAB — HCG, QUANTITATIVE, PREGNANCY: hCG, Beta Chain, Quant, S: 129136 m[IU]/mL — ABNORMAL HIGH (ref <5)

## 2017-09-16 MED ORDER — ONDANSETRON 4 MG PO TBDP
4.0000 mg | ORAL_TABLET | Freq: Once | ORAL | Status: AC
Start: 2017-09-16 — End: 2017-09-16
  Administered 2017-09-16: 4 mg via ORAL
  Filled 2017-09-16: qty 1

## 2017-09-16 MED ORDER — ONDANSETRON HCL 4 MG PO TABS: 4.0000 mg | ORAL_TABLET | Freq: Four times a day (QID) | ORAL | 0 refills | Status: DC

## 2017-09-16 MED ORDER — FAMOTIDINE 20 MG PO TABS: 20.0000 mg | ORAL_TABLET | Freq: Two times a day (BID) | ORAL | 0 refills | Status: DC

## 2017-09-16 NOTE — ED Provider Notes (Signed)
Patient placed in Quick Look pathway, seen and evaluated   Chief Complaint: vaginal bleeding   HPI:   27 year old female presenting with vaginal spotting for 3 days.  She reports associated nausea and vomiting.  She is concerned she is losing weight.  She has a positive pregnancy test 2 months ago.  She has not followed up with OB.  ROS: Vaginal bleeding, nausea, vomiting (one)  Physical Exam:   Gen: No distress  Neuro: Awake and Alert  Skin: Warm    Focused Exam: Diffusely tender to palpation to the abdomen without rebound or guarding.   Initiation of care has begun. The patient has been counseled on the process, plan, and necessity for staying for the completion/evaluation, and the remainder of the medical screening examination    Barkley BoardsMcDonald, Laykin Rainone A, PA-C 09/16/17 1738    Charlynne PanderYao, David Hsienta, MD 09/17/17 [phone]

## 2017-09-16 NOTE — Discharge Instructions (Signed)
Continue taking your prenatal vitamins.  Take Zofran every 6 hours if Unisom plus vitamin B6 does not help.  Take Pepcid twice daily as needed for heartburn.  Make sure to follow-up with your prenatal visit next week.  Please return to the department if you develop any new or worsening symptoms.

## 2017-09-16 NOTE — ED Triage Notes (Signed)
Pt reports that she came to the hospital 2 months ago and had positive urine pregnancy test, she has not seen an OBGYN as directed but states that she has had persistent N/V and is losing weight. Pt also c/o intermittent spotting x 3 days.

## 2017-09-16 NOTE — ED Provider Notes (Signed)
[facility] EMERGENCY DEPARTMENT Provider Note   CSN: [id number] Arrival date & time: 09/16/17  1523     History   Chief Complaint Chief Complaint  Patient presents with  . Emesis During Pregnancy    HPI Tracy Church is a 27 y.o. female who is [redacted] weeks pregnant who presents with nausea, vomiting, heartburn, and a 3-day history of intermittent, slight spotting.  She reports light pink spotting.  She denies any significant pain, but some cramping.  She reports she has lost weight and has not been able to keep really any food down. She has been having nausea and vomiting most of her pregnancy. She has been having heartburn with most everything she eats, if she is able to keep it down.  She denies any chest pain, shortness of breath, abdominal pain.  She has been trying Unisom at home and is also taking prenatal vitamins.  She has not followed up with OB/GYN yet, but does have an appointment in a week.  HPI  Past Medical History:  Diagnosis Date  . Preterm delivery     Patient Active Problem List   Diagnosis Date Noted  . Short cervix, antepartum 03/15/2012  . Late prenatal care, first visit at [redacted]w[redacted]d 03/11/2012  . Circumvallatte placenta, antepartum 02/14/2012    Past Surgical History:  Procedure Laterality Date  . NO PAST SURGERIES       OB History    Gravida  3   Para  1   Term      Preterm  1   AB      Living  1     SAB      TAB      Ectopic      Multiple      Live Births               Home Medications    Prior to Admission medications   Medication Sig Start Date End Date Taking? Authorizing Provider  famotidine (PEPCID) 20 MG tablet Take 1 tablet (20 mg total) by mouth 2 (two) times daily. 09/16/17   Stesha Neyens, Waylan Boga, PA-C  ondansetron (ZOFRAN) 4 MG tablet Take 1 tablet (4 mg total) by mouth every 6 (six) hours. 09/16/17   Emi Holes, PA-C    Family History Family History  Adopted: Yes    Social  History Social History   Tobacco Use  . Smoking status: Former Games developer  . Smokeless tobacco: Never Used  Substance Use Topics  . Alcohol use: Not Currently  . Drug use: Not Currently     Allergies   Patient has no known allergies.   Review of Systems Review of Systems  Constitutional: Negative for chills and fever.  HENT: Negative for facial swelling and sore throat.   Respiratory: Negative for shortness of breath.   Cardiovascular: Negative for chest pain.  Gastrointestinal: Positive for nausea and vomiting. Negative for abdominal pain.  Genitourinary: Positive for vaginal bleeding (spotting, light pink). Negative for dysuria.  Musculoskeletal: Negative for back pain.  Skin: Negative for rash and wound.  Neurological: Negative for headaches.  Psychiatric/Behavioral: The patient is not nervous/anxious.      Physical Exam Updated Vital Signs BP (!) 107/53   Pulse 72   Temp 98.6 F (37 C) (Oral)   Resp (!) 22   Ht 5\' 6"  (1.676 m)   Wt 56.2 kg (124 lb)   LMP 05/15/2017 (Approximate)   SpO2 100%   BMI 20.01 kg/m  Physical Exam  Constitutional: She appears well-developed and well-nourished. No distress.  HENT:  Head: Normocephalic and atraumatic.  Mouth/Throat: Oropharynx is clear and moist. No oropharyngeal exudate.  Eyes: Pupils are equal, round, and reactive to light. Conjunctivae are normal. Right eye exhibits no discharge. Left eye exhibits no discharge. No scleral icterus.  Neck: Normal range of motion. Neck supple. No thyromegaly present.  Cardiovascular: Normal rate, regular rhythm, normal heart sounds and intact distal pulses. Exam reveals no gallop and no friction rub.  No murmur heard. Pulmonary/Chest: Effort normal and breath sounds normal. No stridor. No respiratory distress. She has no wheezes. She has no rales.  Abdominal: Soft. Bowel sounds are normal. She exhibits no distension. There is tenderness in the right lower quadrant, suprapubic area and  left lower quadrant. There is no rebound, no guarding and no CVA tenderness.  Genitourinary: Uterus normal. Pelvic exam was performed with patient supine. Cervix exhibits no motion tenderness. Right adnexum displays no tenderness. Left adnexum displays no tenderness. Vaginal discharge (white discharge) found.  Genitourinary Comments: Chaperone present  Musculoskeletal: She exhibits no edema.  Lymphadenopathy:    She has no cervical adenopathy.  Neurological: She is alert. Coordination normal.  Skin: Skin is warm and dry. No rash noted. She is not diaphoretic. No pallor.  Psychiatric: She has a normal mood and affect.  Nursing note and vitals reviewed.    ED Treatments / Results  Labs (all labs ordered are listed, but only abnormal results are displayed) Labs Reviewed  WET PREP, GENITAL - Abnormal; Notable for the following components:      Result Value   Clue Cells Wet Prep HPF POC PRESENT (*)    WBC, Wet Prep HPF POC MANY (*)    All other components within normal limits  COMPREHENSIVE METABOLIC PANEL - Abnormal; Notable for the following components:   Sodium 134 (*)    Alkaline Phosphatase 35 (*)    All other components within normal limits  CBC - Abnormal; Notable for the following components:   RBC 3.71 (*)    Hemoglobin 11.6 (*)    HCT 35.4 (*)    All other components within normal limits  URINALYSIS, ROUTINE W REFLEX MICROSCOPIC - Abnormal; Notable for the following components:   Glucose, UA 50 (*)    All other components within normal limits  HCG, QUANTITATIVE, PREGNANCY - Abnormal; Notable for the following components:   hCG, Beta Chain, Quant, S 129,136 (*)    All other components within normal limits  LIPASE, BLOOD  GC/CHLAMYDIA PROBE AMP ([place]) NOT AT [facility]    EKG None  Radiology US Ob Comp < 14 Wks  Result Date: 09/16/2017 CLINICAL DATA:  Cramping and spotting x3 days. EXAM: OBSTETRIC <14 WK ULTRASOUND TECHNIQUE: Transvaginal ultrasound was performed  for complete evaluation of the gestation as well as the maternal uterus, adnexal regions, and pelvic cul-de-sac. COMPARISON:  None. FINDINGS: Intrauterine gestational sac: Single Yolk sac:  Not Visualized. Embryo:  Visualized. Cardiac Activity: Visualized. Heart Rate: 150 bpm CRL:   63.1 mm   12 w 5 d                  [place] EDC: 03/26/2018 Subchorionic hemorrhage:  None visualized. Maternal uterus/adnexae: The uterus and both ovaries are unremarkable. IMPRESSION: Single live intrauterine gestation at 12 weeks 5 days without complicating features. No subchorionic hemorrhage is identified. Electronically Signed   By: Tollie Eth M.D.   On: 09/16/2017 20:05    Procedures Procedures (including  critical care time)  Medications Ordered in ED Medications  ondansetron (ZOFRAN-ODT) disintegrating tablet 4 mg (4 mg Oral Given 09/16/17 1601)     Initial Impression / Assessment and Plan / ED Course  I have reviewed the triage vital signs and the nursing notes.  Pertinent labs & imaging results that were available during my care of the patient were reviewed by me and considered in my medical decision making (see chart for details).     Patient with hyperemesis gravidarum. Labs are unremarkable and no serious signs of dehydration. After Zofran in the ED, patient is tolerating PO fluid and food. Pelvic US shows single IUP 12 weeks without complication. No bleeding seen on on pelvic exam and no tenderness. Will discharge home with Zofran for breakthrough emesis, but recommended adding B6 to the Unisom. Will also add Pepcid for heartburn symptoms. Continue prenatal vitamins. Follow up to first prenatal visit next week. Return precautions discussed. Patient understands and agrees with plan. Patient vitals stable throughout ED course and discharged in satisfactory condition.  Final Clinical Impressions(s) / ED Diagnoses   Final diagnoses:  Hyperemesis gravidarum    ED Discharge Orders        Ordered     ondansetron (ZOFRAN) 4 MG tablet  Every 6 hours     09/16/17 2154    famotidine (PEPCID) 20 MG tablet  2 times daily     09/16/17 2154       Emi HolesLaw, Dailin Sosnowski M, PA-C 09/16/17 2332    Derwood KaplanNanavati, Ankit, MD 09/17/17 [phone]

## 2017-09-17 LAB — GC/CHLAMYDIA PROBE AMP (~~LOC~~) NOT AT ARMC
Chlamydia: NEGATIVE
Neisseria Gonorrhea: NEGATIVE

## 2017-09-24 ENCOUNTER — Ambulatory Visit (INDEPENDENT_AMBULATORY_CARE_PROVIDER_SITE_OTHER): Payer: Medicaid Other | Admitting: Advanced Practice Midwife

## 2017-09-24 ENCOUNTER — Encounter: Payer: Self-pay | Admitting: Advanced Practice Midwife

## 2017-09-24 ENCOUNTER — Other Ambulatory Visit: Payer: Self-pay

## 2017-09-24 VITALS — BP 90/61 | HR 77 | Wt 125.0 lb

## 2017-09-24 DIAGNOSIS — Z3482 Encounter for supervision of other normal pregnancy, second trimester: Secondary | ICD-10-CM

## 2017-09-24 DIAGNOSIS — O0990 Supervision of high risk pregnancy, unspecified, unspecified trimester: Secondary | ICD-10-CM | POA: Insufficient documentation

## 2017-09-24 DIAGNOSIS — Z8751 Personal history of pre-term labor: Secondary | ICD-10-CM

## 2017-09-24 DIAGNOSIS — O09299 Supervision of pregnancy with other poor reproductive or obstetric history, unspecified trimester: Secondary | ICD-10-CM | POA: Insufficient documentation

## 2017-09-24 DIAGNOSIS — Z3480 Encounter for supervision of other normal pregnancy, unspecified trimester: Secondary | ICD-10-CM

## 2017-09-24 MED ORDER — PANTOPRAZOLE SODIUM 20 MG PO TBEC
20.0000 mg | DELAYED_RELEASE_TABLET | Freq: Every day | ORAL | 1 refills | Status: DC
Start: 2017-09-24 — End: 2017-10-11

## 2017-09-24 MED ORDER — GLYCOPYRROLATE 1 MG PO TABS: 1.0000 mg | ORAL_TABLET | Freq: Three times a day (TID) | ORAL | 3 refills | Status: DC | PRN

## 2017-09-24 NOTE — Progress Notes (Signed)
  Subjective:    Tracy Church is a G2P0101 2771w6d being seen today for her first obstetrical visit.  Her obstetrical history is significant for history of shortened cervix and subsequent preterm delivery at 30 weeks. Patient does intend to breast feed. Pregnancy history fully reviewed.  Had issues with shortened cervix last pregnancy.  Unclear if this was preterm labor or incompetent cervix, though shortened cervix was found on anatomy US (see note from 03/13/12).  . Was out of town and went into [facility] and [facility].  Baby did well.   Patient reports no complaints.  Vitals:   09/24/17 0847  BP: 90/61  Pulse: 77  Weight: 125 lb (56.7 kg)    HISTORY: OB History  Gravida Para Term Preterm AB Living  2 1   1   1   SAB TAB Ectopic Multiple Live Births          1    # Outcome Date GA Lbr Len/2nd Weight Sex Delivery Anes PTL Lv  2 Current           1 Preterm 05/21/12 4111w5d  2 lb 6 oz (1.077 kg) F Vag-Spont   LIV   Past Medical History:  Diagnosis Date  . Medical history non-contributory   . Preterm delivery    Past Surgical History:  Procedure Laterality Date  . NO PAST SURGERIES     Family History  Adopted: Yes     Exam    Uterus:     Pelvic Exam:    Perineum: No Hemorrhoids, Normal Perineum   Vulva: Bartholin's, Urethra, Skene's normal   Vagina:  normal mucosa, normal discharge   pH:    Cervix: multiparous appearance and Long and closed.  Length approx 3-3.5cm   Adnexa: normal adnexa and no mass, fullness, tenderness   Bony Pelvis: gynecoid  System: Breast:  normal appearance, no masses or tenderness   Skin: normal coloration and turgor, no rashes    Neurologic: oriented, grossly non-focal   Extremities: normal strength, tone, and muscle mass   HEENT neck supple with midline trachea   Mouth/Teeth mucous membranes moist, pharynx normal without lesions   Neck supple and no masses   Cardiovascular: regular rate and rhythm   Respiratory:  appears well, vitals normal, no respiratory distress, acyanotic, normal RR, ear and throat exam is normal, neck free of mass or lymphadenopathy, chest clear, no wheezing, crepitations, rhonchi, normal symmetric air entry   Abdomen: soft, non-tender; bowel sounds normal; no masses,  no organomegaly   Urinary: urethral meatus normal      Assessment:    Pregnancy: G2P0101 Patient Active Problem List   Diagnosis Date Noted  . History of preterm delivery 09/24/2017  . Short cervix, antepartum 03/15/2012  . Late prenatal care, first visit at 6058w2d 03/11/2012  . Circumvallatte placenta, antepartum 02/14/2012        Plan:     Initial labs drawn. Prenatal vitamins. Problem list reviewed and updated. Genetic Screening discussed First Screen: Plan panorama at next visit.  Ultrasound discussed; fetal survey: Ordered cervical length this week as baseline. Discussed Makena injections.  Makena ordered.   Follow up in 2 weeks. 50% of 30 min visit spent on counseling and coordination of care.   Routines reviewed Discussed use of Makena, patient agrees to proceed Discussed cervical length measurement Strict PTL/leaking/bleeding/pressure precautions   Bring back in 2 weeks for recheck of cervix  Wynelle BourgeoisMarie Lyndon Chapel 09/24/2017

## 2017-09-24 NOTE — Patient Instructions (Signed)
Hydroxyprogesterone solution for injection What is this medicine? HYDROXYPROGESTERONE (hye drox ee proe JES ter one) is a female hormone. This medicine is used in women who are pregnant and who have delivered a baby too early (preterm) in the past. It helps lower the risk of having a preterm baby again. This medicine may be used for other purposes; ask your health care provider or pharmacist if you have questions. COMMON BRAND NAME(S): Makena What should I tell my health care provider before I take this medicine? They need to know if you have any of these conditions: -blood clotting disorders -breast, cervical, uterine, or vaginal cancer -depression -diabetes or prediabetes -heart disease -high blood pressure -kidney disease -liver disease -lung or breathing disease, like asthma -migraine headaches -seizures -vaginal bleeding -an unusual or allergic reaction to hydroxyprogesterone, other hormones, medicines, foods, dyes, castor oil, benzyl alcohol, or other preservatives -breast-feeding How should I use this medicine? This medicine is for injection into a muscle. It is given by a health care professional in a hospital or clinic setting. You are likely to get an injection once a week to prevent preterm delivery. Talk to your pediatrician regarding the use of this medicine in children. Special care may be needed. Overdosage: If you think you have taken too much of this medicine contact a poison control center or emergency room at once. NOTE: This medicine is only for you. Do not share this medicine with others. What if I miss a dose? It is important not to miss your dose. Call your doctor or health care professional if you are unable to keep an appointment. What may interact with this medicine? -acetaminophen -bupropion -clozapine -efavirenz -halothane -methadone -nicotine -theophylline, aminophylline -tizanidine This list may not describe all possible interactions. Give your  health care provider a list of all the medicines, herbs, non-prescription drugs, or dietary supplements you use. Also tell them if you smoke, drink alcohol, or use illegal drugs. Some items may interact with your medicine. What should I watch for while using this medicine? Your condition will be monitored carefully while you are receiving this medicine. What side effects may I notice from receiving this medicine? Side effects that you should report to your doctor or health care professional as soon as possible: -allergic reactions like skin rash, itching or hives, swelling of the face, lips, or tongue -breathing problems -breast tissue changes or discharge -changes in vision -confusion, trouble speaking or understanding -depressed mood -increased hunger or thirst -increased urination -pain, redness, or irritation at site where injected -pain, swelling, warmth in the leg -shortness of breath, chest pain, swelling in a leg -sudden numbness or weakness of the face, arm or leg -sudden severe headaches -trouble walking, dizziness, loss of balance or coordination -unusually weak or tired -vaginal bleeding -yellowing of the eyes or skin Side effects that usually do not require medical attention (report to your doctor or health care professional if they continue or are bothersome): -changes in emotions or moods -diarrhea -fluid retention and swelling -nausea This list may not describe all possible side effects. Call your doctor for medical advice about side effects. You may report side effects to FDA at 1-800-FDA-1088. Where should I keep my medicine? This drug is given in a hospital or clinic and will not be stored at home. NOTE: This sheet is a summary. It may not cover all possible information. If you have questions about this medicine, talk to your doctor, pharmacist, or health care provider.  2018 Elsevier/Gold Standard (2009-04-12 11:17:12) Second  Trimester of Pregnancy The second  trimester is from week 13 through week 28, month 4 through 6. This is often the time in pregnancy that you feel your best. Often times, morning sickness has lessened or quit. You may have more energy, and you may get hungry more often. Your unborn baby (fetus) is growing rapidly. At the end of the sixth month, he or she is about 9 inches long and weighs about 1 pounds. You will likely feel the baby move (quickening) between 18 and 20 weeks of pregnancy. Follow these instructions at home:  Avoid all smoking, herbs, and alcohol. Avoid drugs not approved by your doctor.  Do not use any tobacco products, including cigarettes, chewing tobacco, and electronic cigarettes. If you need help quitting, ask your doctor. You may get counseling or other support to help you quit.  Only take medicine as told by your doctor. Some medicines are safe and some are not during pregnancy.  Exercise only as told by your doctor. Stop exercising if you start having cramps.  Eat regular, healthy meals.  Wear a good support bra if your breasts are tender.  Do not use hot tubs, steam rooms, or saunas.  Wear your seat belt when driving.  Avoid raw meat, uncooked cheese, and liter boxes and soil used by cats.  Take your prenatal vitamins.  Take 1500-2000 milligrams of calcium daily starting at the 20th week of pregnancy until you deliver your baby.  Try taking medicine that helps you poop (stool softener) as needed, and if your doctor approves. Eat more fiber by eating fresh fruit, vegetables, and whole grains. Drink enough fluids to keep your pee (urine) clear or pale yellow.  Take warm water baths (sitz baths) to soothe pain or discomfort caused by hemorrhoids. Use hemorrhoid cream if your doctor approves.  If you have puffy, bulging veins (varicose veins), wear support hose. Raise (elevate) your feet for 15 minutes, 3-4 times a day. Limit salt in your diet.  Avoid heavy lifting, wear low heals, and sit up  straight.  Rest with your legs raised if you have leg cramps or low back pain.  Visit your dentist if you have not gone during your pregnancy. Use a soft toothbrush to brush your teeth. Be gentle when you floss.  You can have sex (intercourse) unless your doctor tells you not to.  Go to your doctor visits. Get help if:  You feel dizzy.  You have mild cramps or pressure in your lower belly (abdomen).  You have a nagging pain in your belly area.  You continue to feel sick to your stomach (nauseous), throw up (vomit), or have watery poop (diarrhea).  You have bad smelling fluid coming from your vagina.  You have pain with peeing (urination). Get help right away if:  You have a fever.  You are leaking fluid from your vagina.  You have spotting or bleeding from your vagina.  You have severe belly cramping or pain.  You lose or gain weight rapidly.  You have trouble catching your breath and have chest pain.  You notice sudden or extreme puffiness (swelling) of your face, hands, ankles, feet, or legs.  You have not felt the baby move in over an hour.  You have severe headaches that do not go away with medicine.  You have vision changes. This information is not intended to replace advice given to you by your health care provider. Make sure you discuss any questions you have with your health care  provider. Document Released: 05/16/2009 Document Revised: 07/28/2015 Document Reviewed: 04/22/2012 Elsevier Interactive Patient Education  2017 Elsevier Inc.  

## 2017-09-24 NOTE — Assessment & Plan Note (Signed)
Patient delivered at 30 weeks.

## 2017-09-26 LAB — URINE CULTURE: ORGANISM ID, BACTERIA: NO GROWTH

## 2017-09-30 LAB — CYSTIC FIBROSIS MUTATION 97: Interpretation: NOT DETECTED

## 2017-09-30 LAB — OBSTETRIC PANEL, INCLUDING HIV
ANTIBODY SCREEN: NEGATIVE
BASOS: 0 %
Basophils Absolute: 0 10*3/uL (ref 0.0–0.2)
EOS (ABSOLUTE): 0 10*3/uL (ref 0.0–0.4)
EOS: 0 %
HEMATOCRIT: 36.1 % (ref 34.0–46.6)
HEMOGLOBIN: 12 g/dL (ref 11.1–15.9)
HIV Screen 4th Generation wRfx: NONREACTIVE
Hepatitis B Surface Ag: NEGATIVE
Immature Grans (Abs): 0 10*3/uL (ref 0.0–0.1)
Immature Granulocytes: 0 %
Lymphocytes Absolute: 1.4 10*3/uL (ref 0.7–3.1)
Lymphs: 26 %
MCH: 31.8 pg (ref 26.6–33.0)
MCHC: 33.2 g/dL (ref 31.5–35.7)
MCV: 96 fL (ref 79–97)
MONOS ABS: 0.5 10*3/uL (ref 0.1–0.9)
Monocytes: 9 %
NEUTROS PCT: 65 %
Neutrophils Absolute: 3.4 10*3/uL (ref 1.4–7.0)
Platelets: 193 10*3/uL (ref 150–450)
RBC: 3.77 x10E6/uL (ref 3.77–5.28)
RDW: 12.4 % (ref 12.3–15.4)
RH TYPE: POSITIVE
RPR Ser Ql: NONREACTIVE
Rubella Antibodies, IGG: 7.19 index (ref >0.99)
WBC: 5.4 10*3/uL (ref 3.4–10.8)

## 2017-09-30 LAB — HEMOGLOBINOPATHY EVALUATION
Ferritin: 67 ng/mL (ref 15–150)
HGB C: 0 %
HGB S: 0 %
HGB VARIANT: 0 %
Hgb A2 Quant: 2.3 % (ref 1.8–3.2)
Hgb A: 97.7 % (ref 96.4–98.8)
Hgb F Quant: 0 % (ref 0.0–2.0)
Hgb Solubility: NEGATIVE

## 2017-09-30 LAB — SICKLE CELL SCREEN: Sickle Cell Screen: NEGATIVE

## 2017-10-04 ENCOUNTER — Telehealth: Payer: Self-pay

## 2017-10-04 NOTE — Telephone Encounter (Signed)
[facility]- They have not been able to reach the patient to sign her up for patient assistance program. SO no 17P will be sent for patient until she contacts them or provides insurance. Armandina StammerJennifer Master Touchet RN

## 2017-10-10 ENCOUNTER — Encounter (HOSPITAL_COMMUNITY): Payer: Self-pay

## 2017-10-10 ENCOUNTER — Other Ambulatory Visit (HOSPITAL_COMMUNITY): Payer: Self-pay | Admitting: *Deleted

## 2017-10-10 ENCOUNTER — Ambulatory Visit (HOSPITAL_COMMUNITY)
Admission: RE | Admit: 2017-10-10 | Discharge: 2017-10-10 | Disposition: A | Discharge status: Home / Self Care | Payer: Medicaid Other | Source: Ambulatory Visit | Attending: Advanced Practice Midwife | Admitting: Advanced Practice Midwife

## 2017-10-10 DIAGNOSIS — O09212 Supervision of pregnancy with history of pre-term labor, second trimester: Secondary | ICD-10-CM

## 2017-10-10 DIAGNOSIS — Z3480 Encounter for supervision of other normal pregnancy, unspecified trimester: Secondary | ICD-10-CM

## 2017-10-10 DIAGNOSIS — Z8751 Personal history of pre-term labor: Secondary | ICD-10-CM

## 2017-10-10 DIAGNOSIS — Z3A16 16 weeks gestation of pregnancy: Secondary | ICD-10-CM | POA: Diagnosis not present

## 2017-10-11 ENCOUNTER — Ambulatory Visit (INDEPENDENT_AMBULATORY_CARE_PROVIDER_SITE_OTHER): Payer: Medicaid Other | Admitting: Obstetrics & Gynecology

## 2017-10-11 VITALS — BP 110/56 | HR 89 | Wt 129.1 lb

## 2017-10-11 DIAGNOSIS — O09292 Supervision of pregnancy with other poor reproductive or obstetric history, second trimester: Secondary | ICD-10-CM

## 2017-10-11 DIAGNOSIS — Z8751 Personal history of pre-term labor: Secondary | ICD-10-CM

## 2017-10-11 DIAGNOSIS — O0992 Supervision of high risk pregnancy, unspecified, second trimester: Secondary | ICD-10-CM

## 2017-10-11 DIAGNOSIS — O09212 Supervision of pregnancy with history of pre-term labor, second trimester: Secondary | ICD-10-CM

## 2017-10-11 DIAGNOSIS — O0990 Supervision of high risk pregnancy, unspecified, unspecified trimester: Secondary | ICD-10-CM

## 2017-10-11 DIAGNOSIS — O09299 Supervision of pregnancy with other poor reproductive or obstetric history, unspecified trimester: Secondary | ICD-10-CM

## 2017-10-11 NOTE — Progress Notes (Signed)
   PRENATAL VISIT NOTE  Subjective:  Tracy Church is a 27 y.o. G2P0101 at 5863w2d being seen today for ongoing prenatal care.  She is currently monitored for the following issues for this high-risk pregnancy and has History of preterm delivery; Supervision of high risk pregnancy, antepartum; and History of prior pregnancy with short cervix, currently pregnant on their problem list.  Patient reports no complaints.  Contractions: Not present. Vag. Bleeding: None.  Movement: Present. Denies leaking of fluid.   The following portions of the patient's history were reviewed and updated as appropriate: allergies, current medications, past family history, past medical history, past social history, past surgical history and problem list. Problem list updated.  Objective:   Vitals:   10/11/17 1020  BP: (!) 110/56  Pulse: 89  Weight: 129 lb 1.9 oz (58.6 kg)    Fetal Status: Fetal Heart Rate (bpm): 152   Movement: Present     General:  Alert, oriented and cooperative. Patient is in no acute distress.  Skin: Skin is warm and dry. No rash noted.   Cardiovascular: Normal heart rate noted  Respiratory: Normal respiratory effort, no problems with respiration noted  Abdomen: Soft, gravid, appropriate for gestational age.  Pain/Pressure: Present     Pelvic: Cervical exam performed        Extremities: Normal range of motion.  Edema: None  Mental Status: Normal mood and affect. Normal behavior. Normal judgment and thought content.   Assessment and Plan:  Pregnancy: G2P0101 at 2863w2d  1. Supervision of high risk pregnancy, antepartum  2. History of prior pregnancy with short cervix, currently pregnant Pt felt that since her cervix was not short this pregnancy she was not at risk. She also felt that since her 1st preterm child had no problems, she would be ok.  I have reviewed with her the risks of PTD and explained to her the risks of prematurity.   3. History of preterm delivery Pt called  Makena rep to have Makena sent. She will start it as soon as it arrvies.   Preterm labor symptoms and general obstetric precautions including but not limited to vaginal bleeding, contractions, leaking of fluid and fetal movement were reviewed in detail with the patient. Please refer to After Visit Summary for other counseling recommendations.  Return in about 4 weeks (around 11/08/2017).  Future Appointments  Date Time Provider Department Center  10/24/2017  9:45 AM WH-MFC US 2 WH-MFCUS MFC-US  11/07/2017  9:45 AM WH-MFC US 2 WH-M[facility]US MFC-US  11/21/2017 10:00 AM WH-MFC US 3 WH-MFCUS MFC-US    Willodean Rosenthalarolyn Harraway-Smith, MD

## 2017-10-13 LAB — AFP, SERUM, OPEN SPINA BIFIDA
AFP MoM: 1.03
AFP VALUE AFPOSL: 40.7 ng/mL
GEST. AGE ON COLLECTION DATE: 16 wk
Maternal Age At EDD: 26.3 yr
OSBR RISK 1 IN: 10000
Test Results:: NEGATIVE
WEIGHT: 129 [lb_av]

## 2017-10-15 ENCOUNTER — Telehealth: Payer: Self-pay

## 2017-10-15 NOTE — Telephone Encounter (Signed)
[facility] called to verify how far along pt is in her pregnancy. Makena rep states that pt. Is approved for patient assistance program. States that pt. should receive first auto injector by 10/21/17.

## 2017-10-24 ENCOUNTER — Encounter (HOSPITAL_COMMUNITY): Payer: Self-pay

## 2017-10-24 ENCOUNTER — Ambulatory Visit (HOSPITAL_COMMUNITY)
Admission: RE | Admit: 2017-10-24 | Discharge: 2017-10-24 | Disposition: A | Discharge status: Home / Self Care | Payer: Medicaid Other | Source: Ambulatory Visit | Attending: Advanced Practice Midwife | Admitting: Advanced Practice Midwife

## 2017-10-24 DIAGNOSIS — O09212 Supervision of pregnancy with history of pre-term labor, second trimester: Secondary | ICD-10-CM | POA: Insufficient documentation

## 2017-10-24 DIAGNOSIS — Z3A18 18 weeks gestation of pregnancy: Secondary | ICD-10-CM | POA: Diagnosis not present

## 2017-10-28 ENCOUNTER — Telehealth: Payer: Self-pay

## 2017-10-28 NOTE — Telephone Encounter (Signed)
[facility] [phone] to inquire on delivery status of patients Shanon RosserMakenna auto injectors.   [facility]yanne [facility] with [facility] states that [employer] was scheduled to deliver package on 10-21-17 and they came at 6pm.  [facility] is resend the script.   I asked them to make note we are open Mon- Thurs 8-5pm and Friday 8-12.   Cheyanne states the pharmacy [facility] ) is trying to locate the package  Care manager Derlim is care manager Ext [phone].  Armandina StammerJennifer Howard RN

## 2017-11-01 ENCOUNTER — Telehealth: Payer: Self-pay

## 2017-11-01 NOTE — Telephone Encounter (Signed)
Patient's Makena delivered this morning.   Called and left message for patient to call and schedule appointment for Tuesday or Wednesday next week for injection. Armandina StammerJennifer Jaylena Holloway RN

## 2017-11-07 ENCOUNTER — Encounter (HOSPITAL_COMMUNITY): Payer: Self-pay

## 2017-11-07 ENCOUNTER — Other Ambulatory Visit (HOSPITAL_COMMUNITY): Payer: Self-pay | Admitting: Obstetrics and Gynecology

## 2017-11-07 ENCOUNTER — Ambulatory Visit (HOSPITAL_COMMUNITY)
Admission: RE | Admit: 2017-11-07 | Discharge: 2017-11-07 | Disposition: A | Discharge status: Home / Self Care | Payer: Medicaid Other | Source: Ambulatory Visit | Attending: Advanced Practice Midwife | Admitting: Advanced Practice Midwife

## 2017-11-07 DIAGNOSIS — O09212 Supervision of pregnancy with history of pre-term labor, second trimester: Secondary | ICD-10-CM

## 2017-11-07 DIAGNOSIS — Z3A20 20 weeks gestation of pregnancy: Secondary | ICD-10-CM

## 2017-11-07 DIAGNOSIS — Z363 Encounter for antenatal screening for malformations: Secondary | ICD-10-CM | POA: Diagnosis not present

## 2017-11-07 NOTE — ED Notes (Signed)
Pt reports passing out on 11/03/17 [place] [facility] [employer].  She reports being hungry and feeling dizzy, no injury.  She felt better after eating.  Today BP 99/65, pulse 147, feels "worked up" over some issues at home.  Recheck of pulse 79, pulse ox 100% on room air.

## 2017-11-08 ENCOUNTER — Ambulatory Visit (INDEPENDENT_AMBULATORY_CARE_PROVIDER_SITE_OTHER): Payer: Medicaid Other | Admitting: Family Medicine

## 2017-11-08 VITALS — BP 107/57 | HR 79 | Wt 132.0 lb

## 2017-11-08 DIAGNOSIS — O0990 Supervision of high risk pregnancy, unspecified, unspecified trimester: Secondary | ICD-10-CM

## 2017-11-08 DIAGNOSIS — O0992 Supervision of high risk pregnancy, unspecified, second trimester: Secondary | ICD-10-CM

## 2017-11-08 DIAGNOSIS — O09212 Supervision of pregnancy with history of pre-term labor, second trimester: Secondary | ICD-10-CM | POA: Diagnosis not present

## 2017-11-08 DIAGNOSIS — Z8751 Personal history of pre-term labor: Secondary | ICD-10-CM

## 2017-11-08 DIAGNOSIS — O09292 Supervision of pregnancy with other poor reproductive or obstetric history, second trimester: Secondary | ICD-10-CM

## 2017-11-08 DIAGNOSIS — O09299 Supervision of pregnancy with other poor reproductive or obstetric history, unspecified trimester: Secondary | ICD-10-CM

## 2017-11-08 MED ORDER — HYDROXYPROGESTERONE CAPROATE 275 MG/1.1ML ~~LOC~~ SOAJ
275.0000 mg | Freq: Once | SUBCUTANEOUS | Status: AC
  Administered 2017-11-08: 275 mg via SUBCUTANEOUS

## 2017-11-08 NOTE — Addendum Note (Signed)
Addended by: Lorelle Gibbs L on: 11/08/2017 11:13 AM   Modules accepted: Orders

## 2017-11-08 NOTE — Progress Notes (Signed)
   PRENATAL VISIT NOTE  Subjective:  Tracy Church is a 27 y.o. G2P0101 at [redacted]w[redacted]d being seen today for ongoing prenatal care.  She is currently monitored for the following issues for this high-risk pregnancy and has History of preterm delivery; Supervision of high risk pregnancy, antepartum; and History of prior pregnancy with short cervix, currently pregnant on their problem list.  Patient reports no complaints.  Contractions: Not present. Vag. Bleeding: None.  Movement: Present. Denies leaking of fluid.   The following portions of the patient's history were reviewed and updated as appropriate: allergies, current medications, past family history, past medical history, past social history, past surgical history and problem list. Problem list updated.  Objective:   Vitals:   11/08/17 1022 11/08/17 1026  BP: (!) 109/53 (!) 107/57  Pulse: 76 79  Weight: 132 lb 0.6 oz (59.9 kg)     Fetal Status: Fetal Heart Rate (bpm): 144   Movement: Present     General:  Alert, oriented and cooperative. Patient is in no acute distress.  Skin: Skin is warm and dry. No rash noted.   Cardiovascular: Normal heart rate noted  Respiratory: Normal respiratory effort, no problems with respiration noted  Abdomen: Soft, gravid, appropriate for gestational age.  Pain/Pressure: Present     Pelvic: Cervical exam deferred        Extremities: Normal range of motion.  Edema: None  Mental Status: Normal mood and affect. Normal behavior. Normal judgment and thought content.   Assessment and Plan:  Pregnancy: G2P0101 at [redacted]w[redacted]d  1. Supervision of high risk pregnancy, antepartum FHT and FH normal. - Genetic Screening  2. History of preterm delivery CL 2.63. Continue CL q 2 weeks Start Makena  3. History of prior pregnancy with short cervix, currently pregnant   Preterm labor symptoms and general obstetric precautions including but not limited to vaginal bleeding, contractions, leaking of fluid and fetal  movement were reviewed in detail with the patient. Please refer to After Visit Summary for other counseling recommendations.  Return in about 4 weeks (around 12/06/2017) for OB f/u.  Future Appointments  Date Time Provider Department Center  11/21/2017 10:00 AM WH-MFC [place] 3 WH-MFCUS MFC-US    Levie Heritage, DO

## 2017-11-15 ENCOUNTER — Ambulatory Visit: Payer: Medicaid Other

## 2017-11-18 ENCOUNTER — Ambulatory Visit (INDEPENDENT_AMBULATORY_CARE_PROVIDER_SITE_OTHER): Payer: Medicaid Other

## 2017-11-18 DIAGNOSIS — O09212 Supervision of pregnancy with history of pre-term labor, second trimester: Secondary | ICD-10-CM

## 2017-11-18 DIAGNOSIS — Z8751 Personal history of pre-term labor: Secondary | ICD-10-CM

## 2017-11-18 MED ORDER — HYDROXYPROGESTERONE CAPROATE 275 MG/1.1ML ~~LOC~~ SOAJ
275.0000 mg | SUBCUTANEOUS | Status: AC
  Administered 2017-11-18 – 2018-02-21 (×10): 275 mg via SUBCUTANEOUS

## 2017-11-21 ENCOUNTER — Ambulatory Visit (HOSPITAL_COMMUNITY)
Admission: RE | Admit: 2017-11-21 | Discharge: 2017-11-21 | Disposition: A | Discharge status: Home / Self Care | Payer: Medicaid Other | Source: Ambulatory Visit | Attending: Advanced Practice Midwife | Admitting: Advanced Practice Midwife

## 2017-11-21 ENCOUNTER — Encounter (HOSPITAL_COMMUNITY): Payer: Self-pay

## 2017-11-21 DIAGNOSIS — O09212 Supervision of pregnancy with history of pre-term labor, second trimester: Secondary | ICD-10-CM | POA: Insufficient documentation

## 2017-11-21 DIAGNOSIS — Z3686 Encounter for antenatal screening for cervical length: Secondary | ICD-10-CM

## 2017-11-21 DIAGNOSIS — Z3A22 22 weeks gestation of pregnancy: Secondary | ICD-10-CM | POA: Diagnosis not present

## 2017-11-22 ENCOUNTER — Ambulatory Visit: Payer: Medicaid Other

## 2017-11-29 ENCOUNTER — Ambulatory Visit: Payer: Medicaid Other

## 2017-11-29 VITALS — BP 107/62 | Wt 132.0 lb

## 2017-11-29 DIAGNOSIS — Z8751 Personal history of pre-term labor: Secondary | ICD-10-CM

## 2017-11-29 NOTE — Progress Notes (Signed)
Order placed for follow up cervical lengths. Armandina Stammer RN

## 2017-12-01 ENCOUNTER — Encounter (HOSPITAL_BASED_OUTPATIENT_CLINIC_OR_DEPARTMENT_OTHER): Payer: Self-pay | Admitting: Emergency Medicine

## 2017-12-01 ENCOUNTER — Emergency Department (HOSPITAL_BASED_OUTPATIENT_CLINIC_OR_DEPARTMENT_OTHER)
Admission: EM | Admit: 2017-12-01 | Discharge: 2017-12-01 | Disposition: A | Discharge status: Home / Self Care | Payer: Medicaid Other | Attending: Emergency Medicine | Admitting: Emergency Medicine

## 2017-12-01 ENCOUNTER — Other Ambulatory Visit: Payer: Self-pay

## 2017-12-01 DIAGNOSIS — O2342 Unspecified infection of urinary tract in pregnancy, second trimester: Secondary | ICD-10-CM | POA: Diagnosis not present

## 2017-12-01 DIAGNOSIS — Z79899 Other long term (current) drug therapy: Secondary | ICD-10-CM | POA: Insufficient documentation

## 2017-12-01 DIAGNOSIS — R1030 Lower abdominal pain, unspecified: Secondary | ICD-10-CM | POA: Insufficient documentation

## 2017-12-01 DIAGNOSIS — O9989 Other specified diseases and conditions complicating pregnancy, childbirth and the puerperium: Secondary | ICD-10-CM | POA: Diagnosis not present

## 2017-12-01 DIAGNOSIS — Z3A24 24 weeks gestation of pregnancy: Secondary | ICD-10-CM | POA: Diagnosis not present

## 2017-12-01 DIAGNOSIS — Z7722 Contact with and (suspected) exposure to environmental tobacco smoke (acute) (chronic): Secondary | ICD-10-CM | POA: Diagnosis not present

## 2017-12-01 DIAGNOSIS — N859 Noninflammatory disorder of uterus, unspecified: Secondary | ICD-10-CM | POA: Diagnosis not present

## 2017-12-01 DIAGNOSIS — N858 Other specified noninflammatory disorders of uterus: Secondary | ICD-10-CM

## 2017-12-01 DIAGNOSIS — R8271 Bacteriuria: Secondary | ICD-10-CM | POA: Diagnosis not present

## 2017-12-01 DIAGNOSIS — O34592 Maternal care for other abnormalities of gravid uterus, second trimester: Secondary | ICD-10-CM | POA: Diagnosis not present

## 2017-12-01 LAB — URINALYSIS, ROUTINE W REFLEX MICROSCOPIC
BILIRUBIN URINE: NEGATIVE
GLUCOSE, UA: NEGATIVE mg/dL
HGB URINE DIPSTICK: NEGATIVE
Ketones, ur: NEGATIVE mg/dL
Nitrite: NEGATIVE
Protein, ur: NEGATIVE mg/dL
Specific Gravity, Urine: 1.02 (ref 1.005–1.030)
pH: 7 (ref 5.0–8.0)

## 2017-12-01 LAB — URINALYSIS, MICROSCOPIC (REFLEX)

## 2017-12-01 MED ORDER — CEPHALEXIN 500 MG PO CAPS: 500.0000 mg | ORAL_CAPSULE | Freq: Three times a day (TID) | ORAL | 0 refills | Status: DC

## 2017-12-01 MED ORDER — SODIUM CHLORIDE 0.9 % IV BOLUS
1000.0000 mL | Freq: Once | INTRAVENOUS | Status: AC
  Administered 2017-12-01: 1000 mL via INTRAVENOUS

## 2017-12-01 MED ORDER — CEPHALEXIN 250 MG PO CAPS
500.0000 mg | ORAL_CAPSULE | Freq: Once | ORAL | Status: AC
  Administered 2017-12-01: 500 mg via ORAL
  Filled 2017-12-01: qty 2

## 2017-12-01 NOTE — Progress Notes (Signed)
Spoke with Dr. Despina Hidden. Pt is 23 4/[redacted] weeks gestation presenting with abd pain and decreased fetal movement. No vaginal bleeding or leaking of fluid. FHR baseline is 145, mod variability, no accels, no decels. No uc's tracing out. Pt  Can be OB cleared and fetal monitor dc'd.

## 2017-12-01 NOTE — ED Provider Notes (Addendum)
[facility] EMERGENCY DEPARTMENT Provider Note   CSN: [id number] Arrival date & time: 12/01/17  0645     History   Chief Complaint Chief Complaint  Patient presents with  . Abdominal Pain; [redacted] wks pregnant    HPI Tracy Church is a 27 y.o. female.  HPI Patient is a 27 year old G2 P1 A0 who presents to is currently approximately [redacted] weeks pregnant.  She has had an uncomplicated pregnancy thus far and presents the emergency department with intermittent lower abdominal discomfort which began approximately an hour prior to arrival.  She has had no vaginal bleeding or loss of fluid.  She continues to feel the baby moving.  Fetal heart rate is in the 150s and 60s on arrival to the emergency department.  Symptoms are intermittent and last a few minutes and then resolve.  Denies nausea or vomiting.  No significant urinary frequency or dysuria.  Denies diarrhea.  Reports eating and drinking normally over the past several days.   Past Medical History:  Diagnosis Date  . Medical history non-contributory   . Preterm delivery     Patient Active Problem List   Diagnosis Date Noted  . History of preterm delivery 09/24/2017  . Supervision of high risk pregnancy, antepartum 09/24/2017  . History of prior pregnancy with short cervix, currently pregnant 09/24/2017    Past Surgical History:  Procedure Laterality Date  . NO PAST SURGERIES       OB History    Gravida  2   Para  1   Term      Preterm  1   AB      Living  1     SAB      TAB      Ectopic      Multiple      Live Births  1            Home Medications    Prior to Admission medications   Medication Sig Start Date End Date Taking? Authorizing Provider  hydroxyprogesterone caproate (MAKENA) 250 mg/mL OIL injection Inject 250 mg into the muscle once.   Yes [provider]  cephALEXin (KEFLEX) 500 MG capsule Take 1 capsule (500 mg total) by mouth 3 (three) times daily for 7 days.  12/01/17 12/08/17  Azalia Bilis, MD    Family History Family History  Adopted: Yes    Social History Social History   Tobacco Use  . Smoking status: Passive Smoke Exposure - Never Smoker  . Smokeless tobacco: Never Used  Substance Use Topics  . Alcohol use: Not Currently  . Drug use: Not Currently     Allergies   Patient has no known allergies.   Review of Systems Review of Systems  All other systems reviewed and are negative.    Physical Exam Updated Vital Signs BP (!) 114/57   Pulse 75   Resp 16   Ht 5\' 6"  (1.676 m)   Wt 61.7 kg   LMP 05/15/2017 (Approximate)   SpO2 100%   BMI 21.95 kg/m   Physical Exam  Constitutional: She is oriented to person, place, and time. She appears well-developed and well-nourished. No distress.  HENT:  Head: Normocephalic and atraumatic.  Eyes: EOM are normal.  Neck: Normal range of motion.  Cardiovascular: Normal rate, regular rhythm and normal heart sounds.  Pulmonary/Chest: Effort normal and breath sounds normal.  Abdominal: Soft. She exhibits no distension. There is no tenderness.  Gravid uterus consistent with dates  Musculoskeletal: Normal range  of motion.  Neurological: She is alert and oriented to person, place, and time.  Skin: Skin is warm and dry.  Psychiatric: She has a normal mood and affect. Judgment normal.  Nursing note and vitals reviewed.    ED Treatments / Results  Labs (all labs ordered are listed, but only abnormal results are displayed) Labs Reviewed  URINALYSIS, ROUTINE W REFLEX MICROSCOPIC - Abnormal; Notable for the following components:      Result Value   APPearance HAZY (*)    Leukocytes, UA SMALL (*)    All other components within normal limits  URINALYSIS, MICROSCOPIC (REFLEX) - Abnormal; Notable for the following components:   Bacteria, UA MANY (*)    Trichomonas, UA PRESENT (*)    All other components within normal limits  URINE CULTURE    EKG None  Radiology No results  found.  Procedures Procedures (including critical care time)  Medications Ordered in ED Medications  sodium chloride 0.9 % bolus 1,000 mL (has no administration in time range)  cephALEXin (KEFLEX) capsule 500 mg (has no administration in time range)     Initial Impression / Assessment and Plan / ED Course  I have reviewed the triage vital signs and the nursing notes.  Pertinent labs & imaging results that were available during my care of the patient were reviewed by me and considered in my medical decision making (see chart for details).     Fetal heart rate is good.  No organized contraction here in the emergency department.  Received fluids.  Some bacteriuria.  Patient will be covered with Keflex for possible UTI.  Urine culture sent.  Antibiotics can be discontinued once urine culture is negative.  Primary OB follow-up.  Encouraged to return to [facility] hospital at any time for new or worsening symptoms.  No indication for additional treatment at this time.  Patient on continuous fetal monitoring and tocometry for approximately an hour and a half.  The strips were reviewed by the [facility] at [facility] who recommend discontinuation of fetal and uterine monitoring at this time  Final Clinical Impressions(s) / ED Diagnoses   Final diagnoses:  Lower abdominal pain  Uterine irritability  Bacteriuria    ED Discharge Orders         Ordered    cephALEXin (KEFLEX) 500 MG capsule  3 times daily     12/01/17 0844           Azalia Bilis, MD 12/01/17 6606    Azalia Bilis, MD 12/01/17 [phone]

## 2017-12-01 NOTE — Progress Notes (Signed)
Tresa Endo RN notified that pt's fetal monitor can be dc'd.

## 2017-12-01 NOTE — Progress Notes (Signed)
Spoke with Dr. Shawnie Pons. Pt is 23 4/[redacted] weeks gestation presenting to [facility] with c/o abd cramping and decreased fetal movement. It was reported to the night shift RROB that the fetal heart rate was 170 BPM. FHR is 145 BPM, no accels, no decels, and no uterine activity noted. No vaginal bleeding or leaking of fluid according to the [facility] RN. Pt has a hx of a previous preterm delivery at 30 weeks. She is a G4P1. She was seen by Dr. Erin Fulling on 11/18/2017 and orders were for progesterone 275mg  SQ weekly x 15 doses. There is a sticky note in the chart that says pt's cervical length is 2.63. Orders are in for IVF and a U/A. We will continue to monitor pt.

## 2017-12-01 NOTE — ED Triage Notes (Signed)
Pt reports intermittent abd pain that started ~ 1 hr PTA. Pt placed on fetal monitor. FHR 175. Denies leakage of fluid or vaginal bleeding.

## 2017-12-01 NOTE — Progress Notes (Signed)
Received report from Hall Busing RN. Says she received a call from [facility] at 0710. Pt is 23 4/[redacted] weeks gestation presenting with abd cramping and decreased fetal movement. Hx of previous delivery at 30 weeks. [facility] says that the pt has no vaginal bleeding or leaking of fluid. Says pt is afebrile and fetal heart rate is 170 BPM.

## 2017-12-01 NOTE — Discharge Instructions (Addendum)
Drink lots of fluids  Stay off your feet the best you can today.   Take the antibiotics as prescribed  Follow-up at [facility] in [place] for any new or worsening symptoms

## 2017-12-02 LAB — URINE CULTURE

## 2017-12-06 ENCOUNTER — Ambulatory Visit (INDEPENDENT_AMBULATORY_CARE_PROVIDER_SITE_OTHER): Payer: Medicaid Other | Admitting: Family Medicine

## 2017-12-06 ENCOUNTER — Other Ambulatory Visit (HOSPITAL_COMMUNITY): Payer: Self-pay | Admitting: *Deleted

## 2017-12-06 ENCOUNTER — Ambulatory Visit (HOSPITAL_COMMUNITY)
Admission: RE | Admit: 2017-12-06 | Discharge: 2017-12-06 | Disposition: A | Discharge status: Home / Self Care | Payer: Medicaid Other | Source: Ambulatory Visit | Attending: Family Medicine | Admitting: Family Medicine

## 2017-12-06 ENCOUNTER — Encounter (HOSPITAL_COMMUNITY): Payer: Self-pay

## 2017-12-06 VITALS — BP 114/49 | HR 72 | Wt 140.0 lb

## 2017-12-06 DIAGNOSIS — Z3686 Encounter for antenatal screening for cervical length: Secondary | ICD-10-CM

## 2017-12-06 DIAGNOSIS — O09219 Supervision of pregnancy with history of pre-term labor, unspecified trimester: Secondary | ICD-10-CM | POA: Insufficient documentation

## 2017-12-06 DIAGNOSIS — O09212 Supervision of pregnancy with history of pre-term labor, second trimester: Secondary | ICD-10-CM

## 2017-12-06 DIAGNOSIS — Z8751 Personal history of pre-term labor: Secondary | ICD-10-CM

## 2017-12-06 DIAGNOSIS — O0990 Supervision of high risk pregnancy, unspecified, unspecified trimester: Secondary | ICD-10-CM

## 2017-12-06 DIAGNOSIS — O09899 Supervision of other high risk pregnancies, unspecified trimester: Secondary | ICD-10-CM

## 2017-12-06 DIAGNOSIS — Z3A24 24 weeks gestation of pregnancy: Secondary | ICD-10-CM | POA: Diagnosis not present

## 2017-12-06 NOTE — Progress Notes (Signed)
   PRENATAL VISIT NOTE  Subjective:  Tracy Church is a 27 y.o. G2P0101 at [redacted]w[redacted]d being seen today for ongoing prenatal care.  She is currently monitored for the following issues for this high-risk pregnancy and has History of preterm delivery; Supervision of high risk pregnancy, antepartum; and History of prior pregnancy with short cervix, currently pregnant on their problem list.  Patient reports no complaints.  Contractions: Not present. Vag. Bleeding: None.  Movement: Present. Denies leaking of fluid.   The following portions of the patient's history were reviewed and updated as appropriate: allergies, current medications, past family history, past medical history, past social history, past surgical history and problem list. Problem list updated.  Objective:   Vitals:   12/06/17 1054  BP: (!) 114/49  Pulse: 72  Weight: 140 lb (63.5 kg)    Fetal Status: Fetal Heart Rate (bpm): 145 Fundal Height: 24 cm Movement: Present     General:  Alert, oriented and cooperative. Patient is in no acute distress.  Skin: Skin is warm and dry. No rash noted.   Cardiovascular: Normal heart rate noted  Respiratory: Normal respiratory effort, no problems with respiration noted  Abdomen: Soft, gravid, appropriate for gestational age.  Pain/Pressure: Present     Pelvic: Cervical exam deferred        Extremities: Normal range of motion.  Edema: None  Mental Status: Normal mood and affect. Normal behavior. Normal judgment and thought content.   Assessment and Plan:  Pregnancy: G2P0101 at [redacted]w[redacted]d  1. Supervision of high risk pregnancy, antepartum FHT and FH normal. 28 wk labs next visit  2. History of preterm delivery Makena. CL today.  Preterm labor symptoms and general obstetric precautions including but not limited to vaginal bleeding, contractions, leaking of fluid and fetal movement were reviewed in detail with the patient. Please refer to After Visit Summary for other counseling  recommendations.  Return in about 4 weeks (around 01/03/2018) for OB f/u, 2 hr GTT.  Future Appointments  Date Time Provider Department Center  12/06/2017 12:30 PM [facility] [place] 1 [facility]  01/03/2018  9:00 AM Levie Heritage, DO [facility] None    Levie Heritage, DO

## 2017-12-13 ENCOUNTER — Ambulatory Visit (INDEPENDENT_AMBULATORY_CARE_PROVIDER_SITE_OTHER): Payer: Medicaid Other

## 2017-12-13 VITALS — BP 107/70 | HR 90

## 2017-12-13 DIAGNOSIS — Z8751 Personal history of pre-term labor: Secondary | ICD-10-CM

## 2017-12-13 DIAGNOSIS — O0990 Supervision of high risk pregnancy, unspecified, unspecified trimester: Secondary | ICD-10-CM

## 2017-12-13 DIAGNOSIS — O09299 Supervision of pregnancy with other poor reproductive or obstetric history, unspecified trimester: Secondary | ICD-10-CM

## 2017-12-13 DIAGNOSIS — O09212 Supervision of pregnancy with history of pre-term labor, second trimester: Secondary | ICD-10-CM | POA: Diagnosis not present

## 2017-12-13 NOTE — Progress Notes (Signed)
Tracy Church here for 17-P  Injection.  Injection administered without complication. Patient will return in one week for next injection.  Loyalty Brashier l Corissa Oguinn, CMA 12/13/2017  10:10 AM  Attestation of Attending Supervision of CMA/RN: Evaluation and management procedures were performed by the nurse under my supervision and collaboration.  I have reviewed the nursing note and chart, and I agree with the management and plan.  Carolyn L. Harraway-Smith, M.D., [facility]

## 2017-12-20 ENCOUNTER — Ambulatory Visit (INDEPENDENT_AMBULATORY_CARE_PROVIDER_SITE_OTHER): Payer: Medicaid Other

## 2017-12-20 ENCOUNTER — Encounter (HOSPITAL_COMMUNITY): Payer: Self-pay

## 2017-12-20 ENCOUNTER — Ambulatory Visit (HOSPITAL_COMMUNITY)
Admission: RE | Admit: 2017-12-20 | Discharge: 2017-12-20 | Disposition: A | Discharge status: Home / Self Care | Payer: Medicaid Other | Source: Ambulatory Visit | Attending: Family Medicine | Admitting: Family Medicine

## 2017-12-20 VITALS — BP 114/57 | HR 81 | Wt 144.0 lb

## 2017-12-20 DIAGNOSIS — Z8751 Personal history of pre-term labor: Secondary | ICD-10-CM

## 2017-12-20 DIAGNOSIS — Z3A26 26 weeks gestation of pregnancy: Secondary | ICD-10-CM | POA: Insufficient documentation

## 2017-12-20 DIAGNOSIS — O09219 Supervision of pregnancy with history of pre-term labor, unspecified trimester: Secondary | ICD-10-CM

## 2017-12-20 DIAGNOSIS — O09212 Supervision of pregnancy with history of pre-term labor, second trimester: Secondary | ICD-10-CM | POA: Diagnosis not present

## 2017-12-20 DIAGNOSIS — Z3686 Encounter for antenatal screening for cervical length: Secondary | ICD-10-CM | POA: Insufficient documentation

## 2017-12-20 DIAGNOSIS — O09899 Supervision of other high risk pregnancies, unspecified trimester: Secondary | ICD-10-CM

## 2017-12-20 DIAGNOSIS — O0990 Supervision of high risk pregnancy, unspecified, unspecified trimester: Secondary | ICD-10-CM

## 2017-12-20 NOTE — Progress Notes (Signed)
Tracy Church here for 17-P  Injection.  Injection administered without complication. Patient will return in one week for next injection.  Donnamarie Shankles l Curtistine Pettitt, CMA 12/20/2017  10:29 AM

## 2017-12-20 NOTE — Progress Notes (Signed)
I have reviewed the chart and agree with nursing staff's documentation of this patient's encounter.  [name], MD 12/20/2017 11:57 AM

## 2017-12-27 ENCOUNTER — Ambulatory Visit (INDEPENDENT_AMBULATORY_CARE_PROVIDER_SITE_OTHER): Payer: Medicaid Other

## 2017-12-27 DIAGNOSIS — Z8751 Personal history of pre-term labor: Secondary | ICD-10-CM

## 2017-12-27 DIAGNOSIS — O09212 Supervision of pregnancy with history of pre-term labor, second trimester: Secondary | ICD-10-CM | POA: Diagnosis not present

## 2017-12-27 NOTE — Progress Notes (Signed)
Patient present for 17P injection. Armandina Stammer RN

## 2018-01-03 ENCOUNTER — Ambulatory Visit (INDEPENDENT_AMBULATORY_CARE_PROVIDER_SITE_OTHER): Payer: Medicaid Other | Admitting: Family Medicine

## 2018-01-03 VITALS — BP 108/57 | HR 66 | Wt 145.0 lb

## 2018-01-03 DIAGNOSIS — O09213 Supervision of pregnancy with history of pre-term labor, third trimester: Secondary | ICD-10-CM | POA: Diagnosis not present

## 2018-01-03 DIAGNOSIS — Z23 Encounter for immunization: Secondary | ICD-10-CM

## 2018-01-03 DIAGNOSIS — O0993 Supervision of high risk pregnancy, unspecified, third trimester: Secondary | ICD-10-CM

## 2018-01-03 DIAGNOSIS — O09299 Supervision of pregnancy with other poor reproductive or obstetric history, unspecified trimester: Secondary | ICD-10-CM

## 2018-01-03 DIAGNOSIS — O09293 Supervision of pregnancy with other poor reproductive or obstetric history, third trimester: Secondary | ICD-10-CM

## 2018-01-03 DIAGNOSIS — Z8751 Personal history of pre-term labor: Secondary | ICD-10-CM

## 2018-01-03 DIAGNOSIS — O0990 Supervision of high risk pregnancy, unspecified, unspecified trimester: Secondary | ICD-10-CM

## 2018-01-03 MED ORDER — FAMOTIDINE 20 MG PO TABS: 20.0000 mg | ORAL_TABLET | Freq: Two times a day (BID) | ORAL | 3 refills | Status: DC

## 2018-01-03 NOTE — Progress Notes (Signed)
   PRENATAL VISIT NOTE  Subjective:  Tracy Church is a 27 y.o. G2P0101 at [redacted]w[redacted]d being seen today for ongoing prenatal care.  She is currently monitored for the following issues for this high-risk pregnancy and has History of preterm delivery; Supervision of high risk pregnancy, antepartum; and History of prior pregnancy with short cervix, currently pregnant on their problem list.  Patient reports heartburn.  Contractions: Not present. Vag. Bleeding: Scant.  Movement: Present. Denies leaking of fluid.   The following portions of the patient's history were reviewed and updated as appropriate: allergies, current medications, past family history, past medical history, past social history, past surgical history and problem list. Problem list updated.  Objective:   Vitals:   01/03/18 0914  BP: (!) 108/57  Pulse: 66  Weight: 145 lb (65.8 kg)    Fetal Status: Fetal Heart Rate (bpm): 145 Fundal Height: 28 cm Movement: Present     General:  Alert, oriented and cooperative. Patient is in no acute distress.  Skin: Skin is warm and dry. No rash noted.   Cardiovascular: Normal heart rate noted  Respiratory: Normal respiratory effort, no problems with respiration noted  Abdomen: Soft, gravid, appropriate for gestational age.  Pain/Pressure: Absent     Pelvic: Cervical exam deferred        Extremities: Normal range of motion.  Edema: None  Mental Status: Normal mood and affect. Normal behavior. Normal judgment and thought content.   Assessment and Plan:  Pregnancy: G2P0101 at [redacted]w[redacted]d  1. Supervision of high risk pregnancy, antepartum FHT and FH normal - RPR - Tdap vaccine greater than or equal to 26yo IM - CBC - Glucose Tolerance, 2 Hours w/1 Hour - HIV Antibody (routine testing w rflx)  2. History of preterm delivery  3. History of prior pregnancy with short cervix, currently pregnant  Preterm labor symptoms and general obstetric precautions including but not limited to vaginal  bleeding, contractions, leaking of fluid and fetal movement were reviewed in detail with the patient. Please refer to After Visit Summary for other counseling recommendations.  Return in about 2 weeks (around 01/17/2018).  No future appointments.  Levie Heritage, DO

## 2018-01-04 LAB — CBC
HEMATOCRIT: 33.1 % — AB (ref 34.0–46.6)
Hemoglobin: 10.7 g/dL — ABNORMAL LOW (ref 11.1–15.9)
MCH: 31.5 pg (ref 26.6–33.0)
MCHC: 32.3 g/dL (ref 31.5–35.7)
MCV: 97 fL (ref 79–97)
Platelets: 150 10*3/uL (ref 150–450)
RBC: 3.4 x10E6/uL — ABNORMAL LOW (ref 3.77–5.28)
RDW: 12.8 % (ref 12.3–15.4)
WBC: 9.7 10*3/uL (ref 3.4–10.8)

## 2018-01-04 LAB — RPR: RPR: NONREACTIVE

## 2018-01-04 LAB — GLUCOSE TOLERANCE, 2 HOURS W/ 1HR
GLUCOSE, 1 HOUR: 103 mg/dL (ref 65–179)
GLUCOSE, 2 HOUR: 114 mg/dL (ref 65–152)
GLUCOSE, FASTING: 59 mg/dL — AB (ref 65–91)

## 2018-01-04 LAB — HIV ANTIBODY (ROUTINE TESTING W REFLEX): HIV Screen 4th Generation wRfx: NONREACTIVE

## 2018-01-10 ENCOUNTER — Ambulatory Visit: Payer: Medicaid Other

## 2018-01-10 DIAGNOSIS — Z8751 Personal history of pre-term labor: Secondary | ICD-10-CM

## 2018-01-10 NOTE — Progress Notes (Signed)
Patient presents for her makena injection. Patient states that this injection had a lot of burning in her arm. Armandina Stammer RN

## 2018-01-10 NOTE — Progress Notes (Signed)
Chart reviewed - agree with RN documentation.   

## 2018-01-17 ENCOUNTER — Ambulatory Visit (INDEPENDENT_AMBULATORY_CARE_PROVIDER_SITE_OTHER): Payer: Medicaid Other | Admitting: Family Medicine

## 2018-01-17 VITALS — BP 110/62 | HR 81 | Wt 146.0 lb

## 2018-01-17 DIAGNOSIS — Z8751 Personal history of pre-term labor: Secondary | ICD-10-CM

## 2018-01-17 DIAGNOSIS — O0990 Supervision of high risk pregnancy, unspecified, unspecified trimester: Secondary | ICD-10-CM

## 2018-01-17 DIAGNOSIS — O09299 Supervision of pregnancy with other poor reproductive or obstetric history, unspecified trimester: Secondary | ICD-10-CM

## 2018-01-17 DIAGNOSIS — O0993 Supervision of high risk pregnancy, unspecified, third trimester: Secondary | ICD-10-CM

## 2018-01-17 DIAGNOSIS — O09293 Supervision of pregnancy with other poor reproductive or obstetric history, third trimester: Secondary | ICD-10-CM

## 2018-01-17 NOTE — Progress Notes (Signed)
Patient complaining of bleeding this morning when wiping after going to the restroom. Armandina StammerJennifer Marka Treloar RN

## 2018-01-17 NOTE — Progress Notes (Signed)
   PRENATAL VISIT NOTE  Subjective:  Tracy Church is a 27 y.o. G2P0101 at 2883w2d being seen today for ongoing prenatal care.  She is currently monitored for the following issues for this high-risk pregnancy and has History of preterm delivery; Supervision of high risk pregnancy, antepartum; and History of prior pregnancy with short cervix, currently pregnant on their problem list.  Patient reports spotting that started this morning. Notices when she wipes. No contractions. No recent sex. No discharge or odor.  Contractions: Not present. Vag. Bleeding: Scant.  Movement: Present. Denies leaking of fluid.   The following portions of the patient's history were reviewed and updated as appropriate: allergies, current medications, past family history, past medical history, past social history, past surgical history and problem list. Problem list updated.  Objective:   Vitals:   01/17/18 1027  BP: 110/62  Pulse: 81  Weight: 146 lb (66.2 kg)    Fetal Status: Fetal Heart Rate (bpm): 133 Fundal Height: 30 cm Movement: Present     General:  Alert, oriented and cooperative. Patient is in no acute distress.  Skin: Skin is warm and dry. No rash noted.   Cardiovascular: Normal heart rate noted  Respiratory: Normal respiratory effort, no problems with respiration noted  Abdomen: Soft, gravid, appropriate for gestational age.  Pain/Pressure: Present     Pelvic: Cervical exam performed Dilation: Fingertip Effacement (%): Thick Station: -3. No blood in vaginal vault. Cervix appears friable.   Extremities: Normal range of motion.  Edema: None  Mental Status: Normal mood and affect. Normal behavior. Normal judgment and thought content.   Assessment and Plan:  Pregnancy: G2P0101 at 7783w2d  1. Supervision of high risk pregnancy, antepartum Normal FHT and FH  2. History of preterm delivery Continue makena  3. History of prior pregnancy with short cervix, currently pregnant Cervix feels unlabored  at this point. Preterm labor precautions. If bleeding increases, go to MAU.    Preterm labor symptoms and general obstetric precautions including but not limited to vaginal bleeding, contractions, leaking of fluid and fetal movement were reviewed in detail with the patient. Please refer to After Visit Summary for other counseling recommendations.  No follow-ups on file.  Future Appointments  Date Time Provider Department Center  01/24/2018 10:30 AM [facility] NURSE [facility] None  01/29/2018  1:15 PM Anyanwu, Jethro BastosUgonna A, MD [facility] None  02/07/2018 10:00 AM [facility] NURSE [facility] None    Levie HeritageJacob J Alexxander Kurt, DO

## 2018-01-24 ENCOUNTER — Ambulatory Visit: Payer: Medicaid Other

## 2018-01-29 ENCOUNTER — Encounter: Payer: Medicaid Other | Admitting: Obstetrics & Gynecology

## 2018-01-29 NOTE — Progress Notes (Deleted)
   Patient did not show up today for her scheduled appointment.   Raeli Wiens, MD, FACOG Obstetrician & Gynecologist, [facility], [facility]  

## 2018-02-07 ENCOUNTER — Ambulatory Visit (INDEPENDENT_AMBULATORY_CARE_PROVIDER_SITE_OTHER): Payer: Medicaid Other | Admitting: Family Medicine

## 2018-02-07 VITALS — BP 90/75 | HR 86 | Wt 151.0 lb

## 2018-02-07 DIAGNOSIS — O0990 Supervision of high risk pregnancy, unspecified, unspecified trimester: Secondary | ICD-10-CM

## 2018-02-07 DIAGNOSIS — O09293 Supervision of pregnancy with other poor reproductive or obstetric history, third trimester: Secondary | ICD-10-CM

## 2018-02-07 DIAGNOSIS — Z8751 Personal history of pre-term labor: Secondary | ICD-10-CM

## 2018-02-07 DIAGNOSIS — O09299 Supervision of pregnancy with other poor reproductive or obstetric history, unspecified trimester: Secondary | ICD-10-CM

## 2018-02-07 DIAGNOSIS — O0993 Supervision of high risk pregnancy, unspecified, third trimester: Secondary | ICD-10-CM

## 2018-02-07 DIAGNOSIS — Z3A33 33 weeks gestation of pregnancy: Secondary | ICD-10-CM

## 2018-02-07 MED ORDER — HYDROXYPROGESTERONE CAPROATE 275 MG/1.1ML ~~LOC~~ SOAJ: 275.0000 mg | Freq: Once | SUBCUTANEOUS | Status: DC

## 2018-02-07 MED ORDER — PANTOPRAZOLE SODIUM 40 MG PO TBEC: 40.0000 mg | DELAYED_RELEASE_TABLET | Freq: Every day | ORAL | 3 refills | Status: DC

## 2018-02-07 NOTE — Progress Notes (Signed)
   PRENATAL VISIT NOTE  Subjective:  Tracy Church is a 27 y.o. G2P0101 at 5765w2d being seen today for ongoing prenatal care.  She is currently monitored for the following issues for this high-risk pregnancy and has History of preterm delivery; Supervision of high risk pregnancy, antepartum; and History of prior pregnancy with short cervix, currently pregnant on their problem list.  Patient reports mild spotting.  Contractions: Not present. Vag. Bleeding: Scant.  Movement: Present. Denies leaking of fluid.   The following portions of the patient's history were reviewed and updated as appropriate: allergies, current medications, past family history, past medical history, past social history, past surgical history and problem list. Problem list updated.  Objective:   Vitals:   02/07/18 1009  BP: 90/75  Pulse: 86  Weight: 151 lb 0.6 oz (68.5 kg)    Fetal Status: Fetal Heart Rate (bpm): 139   Movement: Present     General:  Alert, oriented and cooperative. Patient is in no acute distress.  Skin: Skin is warm and dry. No rash noted.   Cardiovascular: Normal heart rate noted  Respiratory: Normal respiratory effort, no problems with respiration noted  Abdomen: Soft, gravid, appropriate for gestational age.  Pain/Pressure: Present     Pelvic: Cervical exam deferred        Extremities: Normal range of motion.  Edema: None  Mental Status: Normal mood and affect. Normal behavior. Normal judgment and thought content.   Assessment and Plan:  Pregnancy: G2P0101 at 5765w2d  1. Supervision of high risk pregnancy, antepartum FHT and FH normal  2. History of preterm delivery No contractions - HYDROXYprogesterone Caproate SOAJ 275 mg  3. History of prior pregnancy with short cervix, currently pregnant   Preterm labor symptoms and general obstetric precautions including but not limited to vaginal bleeding, contractions, leaking of fluid and fetal movement were reviewed in detail with the  patient. Please refer to After Visit Summary for other counseling recommendations.  Return in about 2 weeks (around 02/21/2018).  No future appointments.  Levie HeritageJacob J Stinson, DO

## 2018-02-11 ENCOUNTER — Encounter (HOSPITAL_COMMUNITY): Payer: Self-pay | Admitting: *Deleted

## 2018-02-11 ENCOUNTER — Inpatient Hospital Stay (HOSPITAL_COMMUNITY)
Admit: 2018-02-11 | Discharge: 2018-02-11 | Disposition: A | Discharge status: Home / Self Care | Payer: Medicaid Other | Source: Ambulatory Visit | Attending: Obstetrics & Gynecology | Admitting: Obstetrics & Gynecology

## 2018-02-11 ENCOUNTER — Other Ambulatory Visit: Payer: Self-pay

## 2018-02-11 DIAGNOSIS — O4693 Antepartum hemorrhage, unspecified, third trimester: Secondary | ICD-10-CM | POA: Diagnosis present

## 2018-02-11 DIAGNOSIS — O0990 Supervision of high risk pregnancy, unspecified, unspecified trimester: Secondary | ICD-10-CM

## 2018-02-11 DIAGNOSIS — O9989 Other specified diseases and conditions complicating pregnancy, childbirth and the puerperium: Secondary | ICD-10-CM

## 2018-02-11 DIAGNOSIS — Z3689 Encounter for other specified antenatal screening: Secondary | ICD-10-CM

## 2018-02-11 DIAGNOSIS — Z7722 Contact with and (suspected) exposure to environmental tobacco smoke (acute) (chronic): Secondary | ICD-10-CM | POA: Insufficient documentation

## 2018-02-11 DIAGNOSIS — O2343 Unspecified infection of urinary tract in pregnancy, third trimester: Secondary | ICD-10-CM | POA: Insufficient documentation

## 2018-02-11 DIAGNOSIS — A5901 Trichomonal vulvovaginitis: Secondary | ICD-10-CM | POA: Insufficient documentation

## 2018-02-11 DIAGNOSIS — A599 Trichomoniasis, unspecified: Secondary | ICD-10-CM

## 2018-02-11 DIAGNOSIS — O98313 Other infections with a predominantly sexual mode of transmission complicating pregnancy, third trimester: Secondary | ICD-10-CM | POA: Diagnosis not present

## 2018-02-11 DIAGNOSIS — Z3A33 33 weeks gestation of pregnancy: Secondary | ICD-10-CM | POA: Diagnosis not present

## 2018-02-11 LAB — URINALYSIS, ROUTINE W REFLEX MICROSCOPIC
Bilirubin Urine: NEGATIVE
GLUCOSE, UA: NEGATIVE mg/dL
Hgb urine dipstick: NEGATIVE
Ketones, ur: NEGATIVE mg/dL
NITRITE: NEGATIVE
Protein, ur: 30 mg/dL — AB
RBC / HPF: >50 RBC/hpf — ABNORMAL HIGH (ref 0–5)
Specific Gravity, Urine: 1.023 (ref 1.005–1.030)
pH: 5 (ref 5.0–8.0)

## 2018-02-11 MED ORDER — METRONIDAZOLE 500 MG PO TABS
2000.0000 mg | ORAL_TABLET | Freq: Once | ORAL | Status: AC
  Administered 2018-02-11: 2000 mg via ORAL

## 2018-02-11 MED ORDER — CEPHALEXIN 500 MG PO CAPS: 500.0000 mg | ORAL_CAPSULE | Freq: Four times a day (QID) | ORAL | 0 refills | Status: AC

## 2018-02-11 NOTE — MAU Provider Note (Signed)
History     CSN: [id number]  Arrival date and time: 02/11/18 1402   First Provider Initiated Contact with Patient 02/11/18 1440      Chief Complaint  Patient presents with  . Vaginal Bleeding   HPI  Tracy Church is a 27 y.o. G2P0101 at [redacted]w[redacted]d who presents to MAU with multiple complaints. She denies leaking of fluid, decreased fetal movement, fever, falls, or recent illness. Patient is requesting ultrasound upon Provider arrival in room.  Abdominal tightness  This is a recurring problem, onset within the past two weeks. Patient endorses feeling that her "skin gets tight" when sitting or repositioning. Denies contractions pain. Has not taken medication or   Vaginal spotting This is a recurring problem, first noted at her [facility] visit. Patient endorses intermittent vaginal spotting, limited to when she wipes after voiding but "seems heavier now". Denies urinary symptoms, back pain, pelvic pain, heavy vaginal bleeding, SOB, fever or recent illness.  OB History    Gravida  2   Para  1   Term      Preterm  1   AB      Living  1     SAB      TAB      Ectopic      Multiple      Live Births  1           Past Medical History:  Diagnosis Date  . Medical history non-contributory   . Preterm delivery     Past Surgical History:  Procedure Laterality Date  . NO PAST SURGERIES      Family History  Adopted: Yes    Social History   Tobacco Use  . Smoking status: Passive Smoke Exposure - Never Smoker  . Smokeless tobacco: Never Used  Substance Use Topics  . Alcohol use: Not Currently  . Drug use: Not Currently    Allergies: No Known Allergies  Facility-Administered Medications Prior to Admission  Medication Dose Route Frequency Provider Last Rate Last Dose  . HYDROXYprogesterone Caproate SOAJ 275 mg  275 mg Subcutaneous Weekly Willodean Rosenthal, MD   275 mg at 02/07/18 1022  . HYDROXYprogesterone Caproate SOAJ 275 mg  275 mg  Subcutaneous Once Levie Heritage, DO       Medications Prior to Admission  Medication Sig Dispense Refill Last Dose  . famotidine (PEPCID) 20 MG tablet Take 1 tablet (20 mg total) by mouth 2 (two) times daily. 60 tablet 3 Taking  . hydroxyprogesterone caproate (MAKENA) 250 mg/mL OIL injection Inject 250 mg into the muscle once.   Taking  . pantoprazole (PROTONIX) 40 MG tablet Take 1 tablet (40 mg total) by mouth daily. 30 tablet 3     Review of Systems  Constitutional: Negative for fever.  Respiratory: Negative for shortness of breath.   Gastrointestinal: Negative for abdominal pain.  Genitourinary: Positive for vaginal bleeding. Negative for flank pain.  Musculoskeletal: Negative for back pain.  Neurological: Negative for light-headedness.  All other systems reviewed and are negative.  Physical Exam   Blood pressure (!) 108/57, pulse 96, temperature 98 F (36.7 C), temperature source Oral, resp. rate 20, height 5\' 6"  (1.676 m), weight 70.4 kg, last menstrual period 05/15/2017, SpO2 100 %.  Physical Exam  Nursing note and vitals reviewed. Constitutional: She is oriented to person, place, and time. She appears well-developed and well-nourished.  Cardiovascular: Normal rate.  Respiratory: Effort normal.  GI: Soft. She exhibits no distension. There is no tenderness. There is  no rebound, no guarding and no CVA tenderness.  Neurological: She is alert and oriented to person, place, and time. She has normal reflexes.  Skin: Skin is warm and dry.  Psychiatric: She has a normal mood and affect. Her behavior is normal. Judgment and thought content normal.   Cervix is closed/thick/posterior on exam. No bleeding noted at any time during evaluation in MAU  MAU Course/MDM   --Prenatal care records reviewed --Reactive fetal tracing: baseline 135, moderate varibaility, positive accels, no decels --Toco: contractions x 3 over about 45 min of monitoring, not felt by patient --Closed  cervix  Patient Vitals for the past 24 hrs:  BP Temp Temp src Pulse Resp SpO2 Height Weight  02/11/18 1525 115/65 98.1 F (36.7 C) Oral 80 18 98 % - -  02/11/18 1424 (!) 108/57 98 F (36.7 C) Oral 96 20 100 % - -  02/11/18 1416 - - - - - - 5\' 6"  (1.676 m) 70.4 kg    Results for orders placed or performed during the hospital encounter of 02/11/18 (from the past 24 hour(s))  Urinalysis, Routine w reflex microscopic     Status: Abnormal   Collection Time: 02/11/18  2:18 PM  Result Value Ref Range   Color, Urine YELLOW YELLOW   APPearance CLOUDY (A) CLEAR   Specific Gravity, Urine 1.023 1.005 - 1.030   pH 5.0 5.0 - 8.0   Glucose, UA NEGATIVE NEGATIVE mg/dL   Hgb urine dipstick NEGATIVE NEGATIVE   Bilirubin Urine NEGATIVE NEGATIVE   Ketones, ur NEGATIVE NEGATIVE mg/dL   Protein, ur 30 (A) NEGATIVE mg/dL   Nitrite NEGATIVE NEGATIVE   Leukocytes, UA LARGE (A) NEGATIVE   RBC / HPF >50 (H) 0 - 5 RBC/hpf   WBC, UA 21-50 0 - 5 WBC/hpf   Bacteria, UA RARE (A) NONE SEEN   Squamous Epithelial / LPF 11-20 0 - 5   Mucus PRESENT    Trichomonas, UA PRESENT (A) NONE SEEN    Meds ordered this encounter  Medications  . cephALEXin (KEFLEX) 500 MG capsule    Sig: Take 1 capsule (500 mg total) by mouth 4 (four) times daily for 10 days.    Dispense:  40 capsule    Refill:  0    Order Specific Question:   Supervising Provider    Answer:   Reva BoresPRATT, TANYA S [[id number]]  . metroNIDAZOLE (FLAGYL) tablet 2,000 mg     Assessment and Plan  --27 y.o. G2P0101 at 5218w6d  --Reactive fetal tracing --UTI in pregnancy, rx to pharmacy. Urine culture pending --Trichomonas noted in urine, treated in MAU, declined partner rx, problem list updated --Discharge home in stable condition  F/U: [facility] 02/21/18   Calvert CantorSamantha C Weinhold, CNM 02/11/2018, 4:13 PM

## 2018-02-11 NOTE — MAU Note (Signed)
Presents with c/o spotting dark blood since yesterday.  Denies LOF.  Reports +FM & lower abdominal cramping.

## 2018-02-11 NOTE — Discharge Instructions (Signed)

## 2018-02-12 LAB — CULTURE, OB URINE

## 2018-02-14 ENCOUNTER — Ambulatory Visit: Payer: Medicaid Other

## 2018-02-21 ENCOUNTER — Encounter: Payer: Self-pay | Admitting: Obstetrics & Gynecology

## 2018-02-21 ENCOUNTER — Ambulatory Visit (INDEPENDENT_AMBULATORY_CARE_PROVIDER_SITE_OTHER): Payer: Medicaid Other | Admitting: Obstetrics & Gynecology

## 2018-02-21 VITALS — BP 114/58 | HR 76 | Wt 154.1 lb

## 2018-02-21 DIAGNOSIS — O0993 Supervision of high risk pregnancy, unspecified, third trimester: Secondary | ICD-10-CM

## 2018-02-21 DIAGNOSIS — Z8751 Personal history of pre-term labor: Secondary | ICD-10-CM

## 2018-02-21 DIAGNOSIS — O09293 Supervision of pregnancy with other poor reproductive or obstetric history, third trimester: Secondary | ICD-10-CM

## 2018-02-21 DIAGNOSIS — O0990 Supervision of high risk pregnancy, unspecified, unspecified trimester: Secondary | ICD-10-CM

## 2018-02-21 DIAGNOSIS — A599 Trichomoniasis, unspecified: Secondary | ICD-10-CM

## 2018-02-21 DIAGNOSIS — O09213 Supervision of pregnancy with history of pre-term labor, third trimester: Secondary | ICD-10-CM | POA: Diagnosis not present

## 2018-02-21 DIAGNOSIS — O09299 Supervision of pregnancy with other poor reproductive or obstetric history, unspecified trimester: Secondary | ICD-10-CM

## 2018-02-21 NOTE — Patient Instructions (Signed)
Pain Relief During Labor and Delivery Many things can cause pain during labor and delivery, including:  Pressure on bones and ligaments due to the baby moving through the pelvis.  Stretching of tissues due to the baby moving through the birth canal.  Muscle tension due to anxiety or nervousness.  The uterus tightening (contracting) and relaxing to help move the baby. There are many ways to deal with the pain of labor and delivery. They include:  Taking prenatal classes. Taking these classes helps you know what to expect during your baby's birth. What you learn will increase your confidence and decrease your anxiety.  Practicing relaxation techniques or doing relaxing activities, such as: ? Focused breathing. ? Meditation. ? Visualization. ? Aroma therapy. ? Listening to your favorite music. ? Hypnosis.  Taking a warm shower or bath (hydrotherapy). This may: ? Provide comfort and relaxation. ? Lessen your perception of pain. ? Decrease the amount of pain medicine needed. ? Decrease the length of labor.  Getting a massage or counterpressure on your back.  Applying warm packs or ice packs.  Changing positions often, moving around, or using a birthing ball.  Getting: ? Pain medicine through an IV or injection into a muscle. ? Pain medicine inserted into your spinal column. ? Injections of sterile water just under the skin on your lower back (intradermal injections). ? Laughing gas (nitrous oxide). Discuss your pain control options with your health care provider during your prenatal visits. Explore the options offered by your hospital or birth center. What kinds of medicine are available? There are two kinds of medicines that can be used to relieve pain during labor and delivery:  Analgesics. These medicines decrease pain without causing you to lose feeling or the ability to move your muscles.  Anesthetics. These medicines block feeling in the body and can decrease your  ability to move freely. Both of these kinds of medicine can cause minor side effects, such as nausea, trouble concentrating, and sleepiness. They can also decrease the baby's heart rate before birth and affect the baby's breathing rate after birth. For this reason, health care providers are careful about when and how much medicine is given. What are specific medicines and procedures that provide pain relief? Local Anesthetics Local anesthetics are used to numb a small area of the body. They may be used along with another kind of anesthetic or used to numb the nerves of the vagina, cervix, and perineum during the second stage of labor. General Anesthetics General anesthetics cause you to lose consciousness so you do not feel pain. They are usually only used for an emergency cesarean delivery. General anesthetics are given through an IV tube and a mask. Pudendal Block A pudendal block is a form of local anesthetic. It may be used to relieve the pain associated with pushing or stretching of the perineum at the time of delivery or to further numb the perineum. A pudendal block is done by injecting numbing medicine through the vaginal wall into a nerve in the pelvis. Epidural Analgesia Epidural analgesia is given through a flexible IV catheter that is inserted into the lower back. Numbing medicine is delivered continuously to the area near your spinal column nerves (epidural space). After having this type of analgesia, you may be able to move your legs but you most likely will not be able to walk. Depending on the amount of medicine given, you may lose all feeling in the lower half of your body, or you may retain some level   of sensation, including the urge to push. Epidural analgesia can be used to provide pain relief for a vaginal birth. Spinal Block A spinal block is similar to epidural analgesia, but the medicine is injected into the spinal fluid instead of the epidural space. A spinal block is only given  once. It starts to relieve pain quickly, but the pain relief lasts only 1-6 hours. Spinal blocks can be used for cesarean deliveries. Combined Spinal-Epidural (CSE) Block A CSE block combines the effects of a spinal block and epidural analgesia. The spinal block works quickly to block all pain. The epidural analgesia provides continuous pain relief, even after the effects of the spinal block have worn off. This information is not intended to replace advice given to you by your health care provider. Make sure you discuss any questions you have with your health care provider. Document Released: 06/07/2008 Document Revised: 07/29/2015 Document Reviewed: 07/13/2015 Elsevier Interactive Patient Education  2019 [employer] Natural Childbirth Natural childbirth is when labor and delivery progresses naturally with minimal medical assistance or medicines. Natural childbirth may be an option if you have a low risk pregnancy. With the help of a birthing professional such as a midwife, doula, or other health care provider, you may be able to use relaxation techniques and controlled breathing to manage pain during labor. Many women choose natural childbirth because it makes them feel more in control and in touch with the experience of giving birth. Some women also choose natural childbirth because they are concerned that medicines may affect them and their babies. What types of natural childbirth techniques are available? There are two types of natural childbirth techniques, which are taught in classes:  The Lamaze method. In these classes, parents learn that having a baby is normal, healthy, and natural. Mothers are taught to take a neutral position regarding pain medicine and numbing medicines, and to make an informed decision about using these medicines if the time comes.  The [employer]ley method, also called husband-coached birth. In these classes, the father or partner learns to be the birth coach. The mother is  encouraged to exercise and eat a balanced, nutritious diet. Both parents also learn relaxation and deep breathing exercises and are taught how to prepare for emergency situations. What are some natural pain and relaxation techniques? If you are considering a natural childbirth, you should explore your options for managing pain and discomfort during your labor and delivery. Some natural pain and relaxation techniques include:  Meditation.  Yoga.  Hypnosis.  Acupuncture.  Massage.  Changing positions, such as by walking, rocking, showering, or leaning on birth balls.  Lying in warm water or a whirlpool bath.  Finding an activity that keeps your mind off the labor pain.  Listening to soft music.  Focusing on a particular object (visual imagery). How can I prepare for a natural birth?  Talk with your spouse or partner about your goals for having a natural childbirth.  Decide if you will have your baby in the hospital, at a birthing center, or at home.  Have your spouse or partner attend the natural childbirth technique classes with you.  Decide which type of health care provider you would like to assist you with your delivery.  If you have other children, make plans to have someone take care of them when you go to the hospital or birthing center.  Know the distance and the time it takes to go to the hospital or birthing center. Practice going there and time it  to be sure.  Have a bag packed with a nightgown, bathrobe, and toiletries. Be ready to take it with you when you go into labor.  Keep phone numbers of your family and friends handy if you need to call someone when you go into labor.  Talk with your health care provider about the possibility of a medical emergency and what will happen if that occurs. What are the advantages and disadvantages of natural childbirth? Advantages  You are in control of your labor and delivery experience.  You may be able to avoid some  common medical practices, such as getting medicines or being monitored all the time.  You and your spouse or partner will work together, which can increase your bond with each other.  In most delivery centers, your family and friends can be involved in the labor and delivery process. Disadvantages  The methods of helping relieve labor pains may not work for you.  You may feel disappointed if you change your mind during labor and decide not to have a natural childbirth. What can I expect after delivery?  You may feel very tired.  You may feel uncomfortable as your uterus contracts.  The area around your vagina will feel sore.  You may feel cold and shaky. What questions should I ask my health care provider?  Am I a good candidate for natural childbirth?  Can you refer me to classes to learn more about natural childbirth?  How do I create a birth plan that outlines my wishes for natural childbirth? Where to find more information  American Pregnancy Association: americanpregnancy.org  [employer] of Obstetricians and Gynecologists: acog.org  [employer]-Midwives: www.midwife.org Summary  Natural childbirth may be an option if you have a low risk pregnancy.  The Elige RadonBradley or Lamaze Methods are techniques that can assist you in achieving a natural birth experience.  Talk to your health care provider to determine if you are a good candidate for a natural childbirth. This information is not intended to replace advice given to you by your health care provider. Make sure you discuss any questions you have with your health care provider. Document Released: 02/02/2008 Document Revised: 04/30/2016 Document Reviewed: 04/30/2016 Elsevier Interactive Patient Education  2019 [employer]

## 2018-02-21 NOTE — Progress Notes (Signed)
   PRENATAL VISIT NOTE  Subjective:  Tracy Church is a 27 y.o. G2P0101 at 6834w2d being seen today for ongoing prenatal care.  She is currently monitored for the following issues for this low-risk pregnancy and has History of preterm delivery; Supervision of high risk pregnancy, antepartum; History of prior pregnancy with short cervix, currently pregnant; and Trichomonas infection on their problem list.  Patient reports no complaints.  Contractions: Irritability. Vag. Bleeding: None.  Movement: Present. Denies leaking of fluid.   The following portions of the patient's history were reviewed and updated as appropriate: allergies, current medications, past family history, past medical history, past social history, past surgical history and problem list. Problem list updated.  Objective:   Vitals:   02/21/18 1047  BP: (!) 114/58  Pulse: 76  Weight: 154 lb 1 oz (69.9 kg)    Fetal Status:     Movement: Present     General:  Alert, oriented and cooperative. Patient is in no acute distress.  Skin: Skin is warm and dry. No rash noted.   Cardiovascular: Normal heart rate noted  Respiratory: Normal respiratory effort, no problems with respiration noted  Abdomen: Soft, gravid, appropriate for gestational age.  Pain/Pressure: Present     Pelvic: Cervical exam deferred        Extremities: Normal range of motion.  Edema: Trace  Mental Status: Normal mood and affect. Normal behavior. Normal judgment and thought content.   Assessment and Plan:  Pregnancy: G2P0101 at 4734w2d  1. Supervision of high risk pregnancy, antepartum 36 week cx next visit Check FH next visit. If lagging rec US (it appear that baby was lying transverse) Check presentation next visit  2. Trichomonas infection  3. History of prior pregnancy with short cervix, currently pregnant  4. History of preterm delivery  Preterm labor symptoms and general obstetric precautions including but not limited to vaginal bleeding,  contractions, leaking of fluid and fetal movement were reviewed in detail with the patient. Please refer to After Visit Summary for other counseling recommendations.  Return in about 2 weeks (around 03/07/2018).  No future appointments.  Willodean Rosenthalarolyn Harraway-Smith, MD

## 2018-02-27 ENCOUNTER — Encounter: Payer: Medicaid Other | Admitting: Family Medicine

## 2018-03-04 ENCOUNTER — Ambulatory Visit (INDEPENDENT_AMBULATORY_CARE_PROVIDER_SITE_OTHER): Payer: Medicaid Other | Admitting: Obstetrics and Gynecology

## 2018-03-04 ENCOUNTER — Other Ambulatory Visit (HOSPITAL_COMMUNITY)
Admission: RE | Admit: 2018-03-04 | Discharge: 2018-03-04 | Disposition: A | Discharge status: Home / Self Care | Payer: Medicaid Other | Source: Ambulatory Visit | Attending: Family Medicine | Admitting: Family Medicine

## 2018-03-04 VITALS — BP 117/68 | HR 95 | Wt 158.0 lb

## 2018-03-04 DIAGNOSIS — O0993 Supervision of high risk pregnancy, unspecified, third trimester: Secondary | ICD-10-CM

## 2018-03-04 DIAGNOSIS — O0990 Supervision of high risk pregnancy, unspecified, unspecified trimester: Secondary | ICD-10-CM | POA: Insufficient documentation

## 2018-03-04 NOTE — Progress Notes (Signed)
   PRENATAL VISIT NOTE  Subjective:  Tracy Church is a 27 y.o. G2P0101 at 2452w6d being seen today for ongoing prenatal care.  She is currently monitored for the following issues for this low-risk pregnancy and has History of preterm delivery; Supervision of high risk pregnancy, antepartum; History of prior pregnancy with short cervix, currently pregnant; and Trichomonas infection on their problem list.  Patient reports "tired of being pregnant. "I'm ready for him to get here".  Contractions: Irritability. Vag. Bleeding: None.  Movement: Present. Denies leaking of fluid.   The following portions of the patient's history were reviewed and updated as appropriate: allergies, current medications, past family history, past medical history, past social history, past surgical history and problem list. Problem list updated.  Objective:   Vitals:   03/04/18 1042  BP: 117/68  Pulse: 95  Weight: 158 lb (71.7 kg)    Fetal Status: Fetal Heart Rate (bpm): 145 Fundal Height: 33 cm Movement: Present  Presentation: Vertex  General:  Alert, oriented and cooperative. Patient is in no acute distress.  Skin: Skin is warm and dry. No rash noted.   Cardiovascular: Normal heart rate noted  Respiratory: Normal respiratory effort, no problems with respiration noted  Abdomen: Soft, gravid, appropriate for gestational age.  Pain/Pressure: Present     Pelvic: Cervical exam performed Dilation: 2 Effacement (%): 70 Station: -2  Extremities: Normal range of motion.  Edema: Trace  Mental Status: Normal mood and affect. Normal behavior. Normal judgment and thought content.   Assessment and Plan:  Pregnancy: G2P0101 at 6252w6d  1. Supervision of high risk pregnancy, antepartum  - Culture, beta strep (group b only) - GC/Chlamydia probe amp ([facility])not at [facility]  Preterm labor symptoms and general obstetric precautions including but not limited to vaginal bleeding, contractions, leaking of fluid and fetal  movement were reviewed in detail with the patient. Please refer to After Visit Summary for other counseling recommendations.  Return in about 1 week (around 03/11/2018) for Return OB visit.  Future Appointments  Date Time Provider Department Center  03/12/2018  3:45 PM Conan Bowensavis, Kelly M, MD [facility] None    Raelyn Moraolitta Shauntia Levengood, CNM

## 2018-03-05 NOTE — L&D Delivery Note (Addendum)
I was gloved and present for entire delivery Delivery was uneventful and tolerated well by patient No difficulty with shoulders Placenta delivered spontaneously and grossly intact Perineum as described below. Suturing as described. Agree with delivery note below.  Raelyn Mora, CNM 03/15/2018, 4:42 PM     Delivery Note Patient is a 28 y.o. now G2P0101 s/p NSVD at [redacted]w[redacted]d, who was admitted for SOL.  She progressed with Pitocin augmentation and AROM to complete and pushed to deliver. At 3:15 PM a viable female was delivered via Vaginal, Spontaneous (Presentation: cephalic, LOP).  APGAR: 9, 9; weight pending.  Cord clamping delayed by several minutes then clamped by SNM and cut by FOB.  Placenta intact and spontaneous, bleeding minimal. Cord pH: N/a  Right labial laceration repaired without difficulty, first degree perineal and left labial lacerations did not require repair.  Mom and baby stable prior to transfer to postpartum. She plans on breastfeeding. She is currently undecided about postpartum birth control.  Anesthesia: None Episiotomy: None Lacerations: Right labial (repaired), left labial/first degree perineal (hemostatic, no repair) Suture Repair: vicryl 4.0 Est. Blood Loss (mL):  127  Mom to postpartum.  Baby to Couplet care / Skin to Skin.  Bernerd Limbo, SNM 03/15/2018, 3:54 PM

## 2018-03-07 LAB — CULTURE, BETA STREP (GROUP B ONLY): Strep Gp B Culture: POSITIVE — AB

## 2018-03-07 LAB — GC/CHLAMYDIA PROBE AMP (~~LOC~~) NOT AT ARMC
Chlamydia: NEGATIVE
Neisseria Gonorrhea: NEGATIVE

## 2018-03-12 ENCOUNTER — Ambulatory Visit (INDEPENDENT_AMBULATORY_CARE_PROVIDER_SITE_OTHER): Payer: Medicaid Other | Admitting: Obstetrics and Gynecology

## 2018-03-12 ENCOUNTER — Encounter: Payer: Self-pay | Admitting: Obstetrics and Gynecology

## 2018-03-12 ENCOUNTER — Other Ambulatory Visit (HOSPITAL_COMMUNITY)
Admission: RE | Admit: 2018-03-12 | Discharge: 2018-03-12 | Disposition: A | Discharge status: Home / Self Care | Payer: Medicaid Other | Source: Ambulatory Visit | Attending: Obstetrics and Gynecology | Admitting: Obstetrics and Gynecology

## 2018-03-12 VITALS — BP 121/77 | HR 92 | Wt 160.0 lb

## 2018-03-12 DIAGNOSIS — A599 Trichomoniasis, unspecified: Secondary | ICD-10-CM | POA: Diagnosis present

## 2018-03-12 DIAGNOSIS — O26849 Uterine size-date discrepancy, unspecified trimester: Secondary | ICD-10-CM

## 2018-03-12 DIAGNOSIS — O0990 Supervision of high risk pregnancy, unspecified, unspecified trimester: Secondary | ICD-10-CM

## 2018-03-12 DIAGNOSIS — O0993 Supervision of high risk pregnancy, unspecified, third trimester: Secondary | ICD-10-CM

## 2018-03-12 DIAGNOSIS — O09293 Supervision of pregnancy with other poor reproductive or obstetric history, third trimester: Secondary | ICD-10-CM

## 2018-03-12 DIAGNOSIS — O09299 Supervision of pregnancy with other poor reproductive or obstetric history, unspecified trimester: Secondary | ICD-10-CM

## 2018-03-12 DIAGNOSIS — Z8751 Personal history of pre-term labor: Secondary | ICD-10-CM

## 2018-03-12 DIAGNOSIS — Z3A38 38 weeks gestation of pregnancy: Secondary | ICD-10-CM

## 2018-03-12 DIAGNOSIS — O26843 Uterine size-date discrepancy, third trimester: Secondary | ICD-10-CM

## 2018-03-12 NOTE — Progress Notes (Signed)
   PRENATAL VISIT NOTE  Subjective:  Tracy Church is a 28 y.o. G2P0101 at [redacted]w[redacted]d being seen today for ongoing prenatal care.  She is currently monitored for the following issues for this high-risk pregnancy and has History of preterm delivery; Supervision of high risk pregnancy, antepartum; History of prior pregnancy with short cervix, currently pregnant; and Trichomonas infection on their problem list.  Patient reports occasional contractions.   . Vag. Bleeding: None.  Movement: Present. Denies leaking of fluid.   The following portions of the patient's history were reviewed and updated as appropriate: allergies, current medications, past family history, past medical history, past social history, past surgical history and problem list. Problem list updated.  Objective:   Vitals:   03/12/18 1553  BP: 121/77  Pulse: 92  Weight: 160 lb (72.6 kg)    Fetal Status: Fetal Heart Rate (bpm): 140   Movement: Present     General:  Alert, oriented and cooperative. Patient is in no acute distress.  Skin: Skin is warm and dry. No rash noted.   Cardiovascular: Normal heart rate noted  Respiratory: Normal respiratory effort, no problems with respiration noted  Abdomen: Soft, gravid, appropriate for gestational age.  Pain/Pressure: Present     Pelvic: 3/50/-1      cephalic by palpation, suture palpated on SVE  Extremities: Normal range of motion.  Edema: Trace  Mental Status: Normal mood and affect. Normal behavior. Normal judgment and thought content.   Assessment and Plan:  Pregnancy: G2P0101 at [redacted]w[redacted]d  1. Supervision of high risk pregnancy, antepartum Needs pap PP  2. History of prior pregnancy with short cervix, currently pregnant  3. History of preterm delivery  4. Trichomonas infection TOC today - Cervicovaginal ancillary only( [place])  5. Fetal size inconsistent with dates Growth [place] ordered today - [place] MFM OB FOLLOW UP; Future   Term labor symptoms and general  obstetric precautions including but not limited to vaginal bleeding, contractions, leaking of fluid and fetal movement were reviewed in detail with the patient. Please refer to After Visit Summary for other counseling recommendations.  Return in about 1 week (around 03/19/2018) for OB visit.  No future appointments.  Conan Bowens, MD

## 2018-03-14 LAB — CERVICOVAGINAL ANCILLARY ONLY
Chlamydia: NEGATIVE
Neisseria Gonorrhea: NEGATIVE
Trichomonas: NEGATIVE

## 2018-03-15 ENCOUNTER — Encounter (HOSPITAL_COMMUNITY): Payer: Self-pay | Admitting: Emergency Medicine

## 2018-03-15 ENCOUNTER — Inpatient Hospital Stay (HOSPITAL_COMMUNITY)
Admission: AD | Admit: 2018-03-15 | Discharge: 2018-03-17 | DRG: 806 | Disposition: A | Discharge status: Home / Self Care | Payer: Medicaid Other | Attending: Family Medicine | Admitting: Family Medicine

## 2018-03-15 DIAGNOSIS — Z7722 Contact with and (suspected) exposure to environmental tobacco smoke (acute) (chronic): Secondary | ICD-10-CM | POA: Diagnosis present

## 2018-03-15 DIAGNOSIS — A599 Trichomoniasis, unspecified: Secondary | ICD-10-CM

## 2018-03-15 DIAGNOSIS — Z3490 Encounter for supervision of normal pregnancy, unspecified, unspecified trimester: Secondary | ICD-10-CM

## 2018-03-15 DIAGNOSIS — Z3483 Encounter for supervision of other normal pregnancy, third trimester: Secondary | ICD-10-CM | POA: Diagnosis present

## 2018-03-15 DIAGNOSIS — O99824 Streptococcus B carrier state complicating childbirth: Secondary | ICD-10-CM | POA: Diagnosis present

## 2018-03-15 DIAGNOSIS — Z23 Encounter for immunization: Secondary | ICD-10-CM

## 2018-03-15 DIAGNOSIS — Z3A38 38 weeks gestation of pregnancy: Secondary | ICD-10-CM

## 2018-03-15 DIAGNOSIS — O26873 Cervical shortening, third trimester: Secondary | ICD-10-CM | POA: Diagnosis present

## 2018-03-15 DIAGNOSIS — O0990 Supervision of high risk pregnancy, unspecified, unspecified trimester: Secondary | ICD-10-CM

## 2018-03-15 LAB — CBC
HCT: 31.5 % — ABNORMAL LOW (ref 36.0–46.0)
Hemoglobin: 10.3 g/dL — ABNORMAL LOW (ref 12.0–15.0)
MCH: 31.2 pg (ref 26.0–34.0)
MCHC: 32.7 g/dL (ref 30.0–36.0)
MCV: 95.5 fL (ref 80.0–100.0)
Platelets: 136 10*3/uL — ABNORMAL LOW (ref 150–400)
RBC: 3.3 MIL/uL — ABNORMAL LOW (ref 3.87–5.11)
RDW: 13.8 % (ref 11.5–15.5)
WBC: 9.2 10*3/uL (ref 4.0–10.5)
nRBC: 0 % (ref 0.0–0.2)

## 2018-03-15 LAB — TYPE AND SCREEN
ABO/RH(D): O POS
Antibody Screen: NEGATIVE

## 2018-03-15 LAB — ABO/RH: ABO/RH(D): O POS

## 2018-03-15 LAB — RPR: RPR Ser Ql: NONREACTIVE

## 2018-03-15 MED ORDER — OXYTOCIN 40 UNITS IN NORMAL SALINE INFUSION - SIMPLE MED
1.0000 m[IU]/min | INTRAVENOUS | Status: DC
  Administered 2018-03-15: 2 m[IU]/min via INTRAVENOUS

## 2018-03-15 MED ORDER — COCONUT OIL OIL: 1.0000 "application " | TOPICAL_OIL | Status: DC | PRN

## 2018-03-15 MED ORDER — OXYCODONE-ACETAMINOPHEN 5-325 MG PO TABS: 2.0000 | ORAL_TABLET | ORAL | Status: DC | PRN

## 2018-03-15 MED ORDER — SOD CITRATE-CITRIC ACID 500-334 MG/5ML PO SOLN: 30.0000 mL | ORAL | Status: DC | PRN

## 2018-03-15 MED ORDER — ONDANSETRON HCL 4 MG/2ML IJ SOLN: 4.0000 mg | INTRAMUSCULAR | Status: DC | PRN

## 2018-03-15 MED ORDER — ONDANSETRON HCL 4 MG PO TABS: 4.0000 mg | ORAL_TABLET | ORAL | Status: DC | PRN

## 2018-03-15 MED ORDER — LIDOCAINE HCL (PF) 1 % IJ SOLN
30.0000 mL | INTRAMUSCULAR | Status: AC | PRN
  Administered 2018-03-15: 30 mL via SUBCUTANEOUS
  Filled 2018-03-15: qty 30

## 2018-03-15 MED ORDER — PRENATAL MULTIVITAMIN CH
1.0000 | ORAL_TABLET | Freq: Every day | ORAL | Status: DC
  Administered 2018-03-16 – 2018-03-17 (×2): 1 via ORAL
  Filled 2018-03-15 (×2): qty 1

## 2018-03-15 MED ORDER — BENZOCAINE-MENTHOL 20-0.5 % EX AERO
1.0000 "application " | INHALATION_SPRAY | CUTANEOUS | Status: DC | PRN
  Administered 2018-03-15 – 2018-03-16 (×2): 1 via TOPICAL
  Filled 2018-03-15 (×2): qty 56

## 2018-03-15 MED ORDER — PENICILLIN G 3 MILLION UNITS IVPB - SIMPLE MED
3.0000 10*6.[IU] | INTRAVENOUS | Status: DC
  Administered 2018-03-15 (×2): 3 10*6.[IU] via INTRAVENOUS
  Filled 2018-03-15 (×5): qty 100

## 2018-03-15 MED ORDER — ZOLPIDEM TARTRATE 5 MG PO TABS: 5.0000 mg | ORAL_TABLET | Freq: Every evening | ORAL | Status: DC | PRN

## 2018-03-15 MED ORDER — TERBUTALINE SULFATE 1 MG/ML IJ SOLN
0.2500 mg | Freq: Once | INTRAMUSCULAR | Status: DC | PRN
  Filled 2018-03-15: qty 1

## 2018-03-15 MED ORDER — OXYCODONE-ACETAMINOPHEN 5-325 MG PO TABS: 1.0000 | ORAL_TABLET | ORAL | Status: DC | PRN

## 2018-03-15 MED ORDER — DIPHENHYDRAMINE HCL 25 MG PO CAPS: 25.0000 mg | ORAL_CAPSULE | Freq: Four times a day (QID) | ORAL | Status: DC | PRN

## 2018-03-15 MED ORDER — ACETAMINOPHEN 325 MG PO TABS
650.0000 mg | ORAL_TABLET | ORAL | Status: DC | PRN
  Administered 2018-03-15: 650 mg via ORAL
  Filled 2018-03-15 (×3): qty 2

## 2018-03-15 MED ORDER — ACETAMINOPHEN 325 MG PO TABS
650.0000 mg | ORAL_TABLET | ORAL | Status: DC | PRN
  Administered 2018-03-15: 650 mg via ORAL

## 2018-03-15 MED ORDER — OXYTOCIN 40 UNITS IN NORMAL SALINE INFUSION - SIMPLE MED
2.5000 [IU]/h | INTRAVENOUS | Status: DC
  Filled 2018-03-15: qty 1000

## 2018-03-15 MED ORDER — OXYTOCIN BOLUS FROM INFUSION
500.0000 mL | Freq: Once | INTRAVENOUS | Status: AC
  Administered 2018-03-15: 500 mL via INTRAVENOUS

## 2018-03-15 MED ORDER — ONDANSETRON HCL 4 MG/2ML IJ SOLN: 4.0000 mg | Freq: Four times a day (QID) | INTRAMUSCULAR | Status: DC | PRN

## 2018-03-15 MED ORDER — FENTANYL CITRATE (PF) 100 MCG/2ML IJ SOLN
100.0000 ug | INTRAMUSCULAR | Status: DC | PRN
  Administered 2018-03-15 (×2): 100 ug via INTRAVENOUS
  Filled 2018-03-15 (×2): qty 2

## 2018-03-15 MED ORDER — INFLUENZA VAC SPLIT QUAD 0.5 ML IM SUSY
0.5000 mL | PREFILLED_SYRINGE | INTRAMUSCULAR | Status: AC
  Administered 2018-03-16: 0.5 mL via INTRAMUSCULAR
  Filled 2018-03-15: qty 0.5

## 2018-03-15 MED ORDER — IBUPROFEN 600 MG PO TABS
600.0000 mg | ORAL_TABLET | Freq: Four times a day (QID) | ORAL | Status: DC
  Administered 2018-03-15 – 2018-03-17 (×8): 600 mg via ORAL
  Filled 2018-03-15 (×8): qty 1

## 2018-03-15 MED ORDER — TETANUS-DIPHTH-ACELL PERTUSSIS 5-2.5-18.5 LF-MCG/0.5 IM SUSP: 0.5000 mL | Freq: Once | INTRAMUSCULAR | Status: DC

## 2018-03-15 MED ORDER — SIMETHICONE 80 MG PO CHEW: 80.0000 mg | CHEWABLE_TABLET | ORAL | Status: DC | PRN

## 2018-03-15 MED ORDER — DIBUCAINE 1 % RE OINT: 1.0000 "application " | TOPICAL_OINTMENT | RECTAL | Status: DC | PRN

## 2018-03-15 MED ORDER — LACTATED RINGERS IV SOLN
INTRAVENOUS | Status: DC
  Administered 2018-03-15: 12:00:00 via INTRAVENOUS

## 2018-03-15 MED ORDER — SENNOSIDES-DOCUSATE SODIUM 8.6-50 MG PO TABS
2.0000 | ORAL_TABLET | ORAL | Status: DC
  Administered 2018-03-15 – 2018-03-16 (×2): 2 via ORAL
  Filled 2018-03-15 (×2): qty 2

## 2018-03-15 MED ORDER — SODIUM CHLORIDE 0.9 % IV SOLN
5.0000 10*6.[IU] | Freq: Once | INTRAVENOUS | Status: AC
  Administered 2018-03-15: 5 10*6.[IU] via INTRAVENOUS
  Filled 2018-03-15: qty 5

## 2018-03-15 MED ORDER — PHENOL 1.4 % MT LIQD
1.0000 | OROMUCOSAL | Status: DC | PRN
  Filled 2018-03-15: qty 177

## 2018-03-15 MED ORDER — LACTATED RINGERS IV SOLN: 500.0000 mL | INTRAVENOUS | Status: DC | PRN

## 2018-03-15 MED ORDER — WITCH HAZEL-GLYCERIN EX PADS: 1.0000 "application " | MEDICATED_PAD | CUTANEOUS | Status: DC | PRN

## 2018-03-15 MED ORDER — FLEET ENEMA 7-19 GM/118ML RE ENEM: 1.0000 | ENEMA | RECTAL | Status: DC | PRN

## 2018-03-15 NOTE — Progress Notes (Signed)
Tracy Church is a 28 y.o. G2P0101 at [redacted]w[redacted]d admitted for active labor (contractions).  Subjective: Patient resting in bed, having to breathe through contractions, was feeling some nausea that has subsided. Able to ambulate to the bathroom and reposition herself easily. Tolerated CE and AROM well. Does not want epidural. Coping well with contractions.  Objective: BP (!) 125/94   Pulse 87   Temp 97.7 F (36.5 C) (Oral)   Resp 18   Ht 5\' 6"  (1.676 m)   Wt 71 kg   LMP 05/15/2017 (Approximate)   SpO2 100%   BMI 25.26 kg/m  No intake/output data recorded.  FHT:  FHR: 145 bpm, variability: moderate,  accelerations:  Present,  decelerations:  Present variables UC:   regular, every 2 minutes SVE:   Dilation: 8 Effacement (%): 80 Station: 0 Exam by:: Larrell Rapozo, CNM  Labs: Lab Results  Component Value Date   WBC 9.2 03/15/2018   HGB 10.3 (L) 03/15/2018   HCT 31.5 (L) 03/15/2018   MCV 95.5 03/15/2018   PLT 136 (L) 03/15/2018    Assessment / Plan: Augmentation of labor, progressing well  Labor: Progressing normally Preeclampsia:  no signs or symptoms of toxicity Fetal Wellbeing:  Category I Pain Control:  IV pain meds I/D:  n/a Anticipated MOD:  NSVD  Bernerd Limbo, SNM 03/15/2018, 2:02 PM

## 2018-03-15 NOTE — Anesthesia Pain Management Evaluation Note (Signed)
  [facility] Pain Management Visit Note  Patient: Tracy Church, 28 y.o., female  "Hello I am a member of the anesthesia team at [facility]. We have an anesthesia team available at all times to provide care throughout the hospital, including epidural management and anesthesia for C-section. I don't know your plan for the delivery whether it a natural birth, water birth, IV sedation, nitrous supplementation, doula or epidural, but we want to meet your pain goals."   1.Was your pain managed to your expectations on prior hospitalizations?   No prior hospitalizations  2.What is your expectation for pain management during this hospitalization?     Labor support without medications and IV pain meds  3.How can we help you reach that goal? Possibly an epidural if patient changes her mind. She is unsure of her birth plan at this time.  Record the patient's initial score and the patient's pain goal.   Pain: 2  Pain Goal: 8 [facility] wants you to be able to say your pain was always managed very well.  Agam Tuohy 03/15/2018

## 2018-03-15 NOTE — Progress Notes (Signed)
Tracy Church is a 28 y.o. G2P0101 at [redacted]w[redacted]d by ultrasound admitted for active labor (contractions that began at 3am).  Subjective: Patient up and walking around, tolerated CE well. Cervix still posterior, baby still high, did not AROM.  Objective: BP 128/69   Pulse 82   Temp 97.6 F (36.4 C) (Axillary)   Resp 18   Ht 5\' 6"  (1.676 m)   Wt 71 kg   LMP 05/15/2017 (Approximate)   SpO2 100%   BMI 25.26 kg/m  No intake/output data recorded.  FHT:  FHR: 145 bpm, variability: moderate,  accelerations:  Present,  decelerations:  Absent UC:   irregular, every 2-4 minutes SVE:   Dilation: 6 Effacement (%): 60 Station: -2, -3 Exam by:: Raney Antwine, CNM  Labs: Lab Results  Component Value Date   WBC 9.2 03/15/2018   HGB 10.3 (L) 03/15/2018   HCT 31.5 (L) 03/15/2018   MCV 95.5 03/15/2018   PLT 136 (L) 03/15/2018    Assessment / Plan: Spontaneous labor, progressing normally  Labor: Begin Pitocin titration, will progress to AROM Preeclampsia:  no signs or symptoms of toxicity Fetal Wellbeing:  Category I Pain Control:  Labor support without medications I/D:  n/a Anticipated MOD:  NSVD  Bernerd Limbo, NSVD 03/15/2018, 10:58 AM

## 2018-03-15 NOTE — H&P (Addendum)
LABOR AND DELIVERY ADMISSION HISTORY AND PHYSICAL NOTE  Tracy Church is a 28 y.o. female G20P0101 with IUP at [redacted]w[redacted]d by First trimester [place] presenting for SOL.   She reports positive fetal movement. She denies leakage of fluid or vaginal bleeding.  Prenatal History/Complications: PNC at [facility] Pregnancy complications:  - Short cervix  Past Medical History: Past Medical History:  Diagnosis Date  . Medical history non-contributory   . Preterm delivery     Past Surgical History: Past Surgical History:  Procedure Laterality Date  . NO PAST SURGERIES      Obstetrical History: OB History    Gravida  2   Para  1   Term      Preterm  1   AB      Living  1     SAB      TAB      Ectopic      Multiple      Live Births  1           Social History: Social History   Socioeconomic History  . Marital status: Single    Spouse name: Not on file  . Number of children: Not on file  . Years of education: Not on file  . Highest education level: Not on file  Occupational History  . Not on file  Social Needs  . Financial resource strain: Not on file  . Food insecurity:    Worry: Not on file    Inability: Not on file  . Transportation needs:    Medical: Not on file    Non-medical: Not on file  Tobacco Use  . Smoking status: Passive Smoke Exposure - Never Smoker  . Smokeless tobacco: Never Used  Substance and Sexual Activity  . Alcohol use: Not Currently  . Drug use: Not Currently  . Sexual activity: Not Currently    Birth control/protection: None  Lifestyle  . Physical activity:    Days per week: Not on file    Minutes per session: Not on file  . Stress: Not on file  Relationships  . Social connections:    Talks on phone: Not on file    Gets together: Not on file    Attends religious service: Not on file    Active member of club or organization: Not on file    Attends meetings of clubs or organizations: Not on file    Relationship status: Not on  file  Other Topics Concern  . Not on file  Social History Narrative  . Not on file    Family History: Family History  Adopted: Yes    Allergies: No Known Allergies  Facility-Administered Medications Prior to Admission  Medication Dose Route Frequency Provider Last Rate Last Dose  . HYDROXYprogesterone Caproate SOAJ 275 mg  275 mg Subcutaneous Once Stinson, Jacob J, DO       Medications Prior to Admission  Medication Sig Dispense Refill Last Dose  . calcium carbonate (TUMS - DOSED IN MG ELEMENTAL CALCIUM) 500 MG chewable tablet Chew 1 tablet by mouth 3 (three) times daily as needed for indigestion or heartburn.   Not Taking  . famotidine (PEPCID) 20 MG tablet Take 1 tablet (20 mg total) by mouth 2 (two) times daily. (Patient not taking: Reported on 02/21/2018) 60 tablet 3 Not Taking  . hydroxyprogesterone caproate (MAKENA) 250 mg/mL OIL injection Inject 250 mg into the muscle once.   Taking  . pantoprazole (PROTONIX) 40 MG tablet Take 1 tablet (40 mg  total) by mouth daily. 30 tablet 3 Taking   Review of Systems  All systems reviewed and negative except as stated in HPI  Physical Exam Blood pressure 118/67, pulse 75, temperature 98 F (36.7 C), resp. rate 18, height 5\' 6"  (1.676 m), weight 71.7 kg, last menstrual period 05/15/2017, SpO2 100 %. General appearance: alert, oriented, NAD Lungs: normal respiratory effort Heart: regular rate Abdomen: soft, non-tender; gravid, FH appropriate for GA Extremities: No calf swelling or tenderness Presentation: cephalic Fetal monitoring: baseline 130, variability moderate, accel 15x15, decelerations none Uterine activity:  Dilation: 5.5 Effacement (%): 80 Station: -2 Exam by:: Everlene FarrierHilary Hardy, RN   Prenatal labs: ABO, Rh: O/Positive/-- (07/23 0946) Antibody: Negative (07/23 0946) Rubella: 7.19 (07/23 0946) RPR: Non Reactive (11/01 1035)  HBsAg: Negative (07/23 0946)  HIV: Non Reactive (11/01 1035)  GC/Chlamydia: negative/negative  (03/04/2018) GBS: Positive 2-hr GTT: WNL at 114. Genetic screening:  Not performed Anatomy US: WNL, boy  Prenatal Transfer Tool  Maternal Diabetes: No Genetic Screening: Normal Maternal Ultrasounds/Referrals: Normal Fetal Ultrasounds or other Referrals:  None Maternal Substance Abuse:  No Significant Maternal Medications:  None Significant Maternal Lab Results: Lab values include: Group B Strep positive  No results found for this or any previous visit (from the past 24 hour(s)).  Patient Active Problem List   Diagnosis Date Noted  . Trichomonas infection 02/11/2018  . History of preterm delivery 09/24/2017  . Supervision of high risk pregnancy, antepartum 09/24/2017  . History of prior pregnancy with short cervix, currently pregnant 09/24/2017    Assessment: Tracy Church is a 28 y.o. G2P0101 at 6335w3d here for SOL.  #Labor: SOL #Pain: Tylenol, fentanyl #FWB: Category I #ID:  GBS +  #MOF: Breast and Bottle #MOC:Unsure but wants Contraception #Circ:  Yes, outpatient  Dollene ClevelandHannah C Anderson 03/15/2018, 4:52 AM   OB FELLOW HISTORY AND PHYSICAL ATTESTATION  I have seen and examined this patient; I agree with above documentation in the resident's note.   Marcy Sirenatherine Zyire Eidson, D.O. OB Fellow  03/17/2018, 2:36 PM

## 2018-03-15 NOTE — MAU Note (Signed)
Pt arrives to mau with ctx that started to be really painful about 3 AM. Denies LOF, VB. +FM.

## 2018-03-16 ENCOUNTER — Encounter (HOSPITAL_COMMUNITY): Payer: Self-pay | Admitting: Family Medicine

## 2018-03-16 LAB — CBC
HCT: 30.4 % — ABNORMAL LOW (ref 36.0–46.0)
Hemoglobin: 9.9 g/dL — ABNORMAL LOW (ref 12.0–15.0)
MCH: 31.4 pg (ref 26.0–34.0)
MCHC: 32.6 g/dL (ref 30.0–36.0)
MCV: 96.5 fL (ref 80.0–100.0)
PLATELETS: 145 10*3/uL — AB (ref 150–400)
RBC: 3.15 MIL/uL — ABNORMAL LOW (ref 3.87–5.11)
RDW: 13.9 % (ref 11.5–15.5)
WBC: 14 10*3/uL — ABNORMAL HIGH (ref 4.0–10.5)
nRBC: 0 % (ref 0.0–0.2)

## 2018-03-16 NOTE — Progress Notes (Signed)
Clinical Social Worker following patient for support. CSW and CSW Angel met patient at bedside to discuss patient Edinburgh Postnatal Depression Scale results. Patient scored a 10 out of 30. Patient had family in the room and requested that they stay during assessment. Patient stated she has support from her mother and the FOB. Patient denied being in a domestic violence relationship and stated she feels safe with the FOB. CSW Angel asked patient if she has experience PPD during patient's first pregnancy.  Patient stated "she probably was depressed because she had premature baby. CSW went over the symptoms of Postpartum to make sure patient is aware of what to look for if she start experiencing any of the symptoms. CSW Angel gave patient resources to assist patient out in the community, as well as a copy of the Postpartum Progress Worksheet that patient can utilize. Patient denies any suicidal and homicidal ideation. Patient endorsed feeling good and having appropriately bonded with baby. No further intervention required/no barriers to discharge.   

## 2018-03-16 NOTE — Lactation Note (Signed)
This note was copied from a baby's chart. Lactation Consultation Note  Patient Name: Tracy Church [id number] Date: 03/16/2018 Reason for consult: Initial assessment;Early term 37-38.6wks  1512 - 1526 - I visited Tracy Church today to assess her progress with breast feeding. Tracy Church states that her feeding plan is breast and bottle. I noted a bottle at the bedside.   Tracy Church breast fed her first baby and states that baby "Cayden" is latching well. She states that she is putting him to breast every 2 hours and providing supplementation via a bottle following. He has been taking approximately 10-15 cc's of ABM after some feedings; Tracy Church does not provide formula each time, only as needed.  We discussed day 1 and day 2 infant feeding patterns and nighttime cluster feeding behavior. I recommended that she always breast feed first and discussed differences between colostrum and mature milk in terms of constitution and milk volume.  Tracy Church is interested in signing up for [facility] and seeing if she is eligible for a breast pump. She is also interested in pumping now to stimulate milk production. She was eating her lunch at the time of this visit, and she asked that I return later to set up breast pump. I recommended that was observe a feedign prior to her discharge. I provided her with my contact information and recommended that she page when baby shows hunger cues.  Tracy Church verbalized understanding.   1717 - 1756 - I returned to Tracy Church's room to provide her with a DEBP. Her visitors packed up to leave. Baby was on mother's right breast in cradle position. He was positioned under her breast thus pulling down on her nipple. Tracy Church commented that it was painful when he latched.  I offered to assist. First I changed a meconium diaper. I then assisted with leaning patient back in bed and better supporting baby with pillows. I showed Tracy Church how to do a "U" hold in cross cradle position on  her right breast to stimulate a deeper latch. Baby latches readily with rhythmic suckling sequences.  I showed patient how to gently tug chin for deeper latch, and I encouraged her to gently pester baby to keep him awake at the breast. While she fed, I set up her breast pump and showed her how to use it and clean it.   When baby finished feeding, I showed Tracy Church how to safely remove him from the breast. When I reviewed hand expression her milk shot out very quickly. She states that with her first, she had so much extra pumped milk, that she was encouraged to donate. I encouraged her to use her DEBP, and supplement with her pumped milk as she is able.  Maternal Data Does the patient have breastfeeding experience prior to this delivery?: Yes  Feeding Feeding Type: Breast Fed  LATCH Score                   Interventions    Lactation Tools Discussed/Used     Consult Status Consult Status: Follow-up Date: 03/16/18 Follow-up type: In-patient    Walker Shadow 03/16/2018, 3:33 PM

## 2018-03-16 NOTE — Progress Notes (Signed)
Post Partum Day 1 Subjective: no complaints, up ad lib, voiding and tolerating PO  Objective: Blood pressure (!) 104/56, pulse 79, temperature 98.5 F (36.9 C), temperature source Oral, resp. rate 17, height 5\' 6"  (1.676 m), weight 71 kg, last menstrual period 05/15/2017, SpO2 99 %, unknown if currently breastfeeding.  Physical Exam:  General: alert, cooperative and no distress Lochia: appropriate Uterine Fundus: firm Incision: n/a DVT Evaluation: No evidence of DVT seen on physical exam.  Recent Labs    03/15/18 0813 03/16/18 0617  HGB 10.3* 9.9*  HCT 31.5* 30.4*    Assessment/Plan: Plan for discharge tomorrow and Social Work consult   LOS: 1 day   Rolm Bookbinder CNM 03/16/2018, 10:11 PM

## 2018-03-17 ENCOUNTER — Ambulatory Visit (HOSPITAL_COMMUNITY): Admission: RE | Admit: 2018-03-17 | Payer: Medicaid Other | Source: Ambulatory Visit

## 2018-03-17 MED ORDER — IBUPROFEN 600 MG PO TABS: 600.0000 mg | ORAL_TABLET | Freq: Four times a day (QID) | ORAL | 0 refills | Status: DC

## 2018-03-17 NOTE — Lactation Note (Addendum)
This note was copied from a baby's chart. Lactation Consultation Note  Patient Name: Tracy Church [id number] Date: 03/17/2018 Reason for consult: Follow-up assessment  Baby is 44 hours  Per MBURN - LC was requested to see mom and dyad prior to D/C.  Per mom baby is latching well and its comfortable.  Discussed nutritive vs non - nutritive feeding patterns and the importance  Of watching the baby for hanging out latched.  Denies soreness / has hand pump and the DEBP kit.  LC reviewed sore nipple and engorgement prevention and tx.  Per mom isn't active with WIC. [place] spoke with the [facility] reps from  [place] - and they looked her up on their roster for [facility],  She is active with [employer]. LC went back to see mom and  Recommended for her to call [facility] prior to D/C and see if they could  Get her in for appt. Today. If not today tomorrow. [facility] faxed a form to request a DEBP. LC unable to assess feeding at the breast or bottle feeding / baby recently was fed at 1030 am And sleeping.   LCPlan :  8-10 feedings a day -  Due to baby being early term . Less than 6 pounds  Feed the baby 1st breast 15 -20 mins ( 30 mins max and supplement 30 ml )  Post  pump both breast 10 -15 mins / save milk for next feeding.  Next feeding switch to the other breast and feed and follow same routine.  Use hand pump for now until DEBP is obtained from [facility] .    LC to fax a request for a DEBP to [place] [facility] office @ [phone]      Maternal Data    Feeding Feeding Type: Breast Fed Nipple Type: Slow - flow  LATCH Score                   Interventions Interventions: Breast feeding basics reviewed  Lactation Tools Discussed/Used     Consult Status Consult Status: Complete Date: 03/17/18 Follow-up type: In-patient    [place] 03/17/2018, 12:12 PM

## 2018-03-17 NOTE — Discharge Summary (Addendum)
OB Discharge Summary     Patient Name: Tracy Church DOB: Dec 21, 1990 MRN: [id number]  Date of admission: 03/15/2018 Delivering MD: Edd Arbour R   Date of discharge: 03/17/2018  Admitting diagnosis: 38 wks ctx Intrauterine pregnancy: [redacted]w[redacted]d     Secondary diagnosis:  Active Problems:   Pregnancy  Additional problems: short cervix     Discharge diagnosis: Term Pregnancy Delivered                                                                                                Post partum procedures:none  Augmentation: Pitocin  Complications: None  Hospital course:  Onset of Labor With Vaginal Delivery     28 y.o. yo [id number] at [redacted]w[redacted]d was admitted in Active Labor on 03/15/2018. Patient had an uncomplicated labor course as follows:  Admitted on 1/11 for SOL, dilated to 5.5 cm. She was started on pitocin shorly after admission. She progressed quickly and had a NSVD on 1/11. Delivery was routine without any complications. Patient had a normal post-delivery course with no complications. She is ambulating, tolerating a regular diet, passing flatus, and urinating well. Patient is discharged home in stable condition on 03/17/18.   Membrane Rupture Time/Date: 1:35 PM ,03/15/2018   Intrapartum Procedures: Episiotomy: None [1]                                         Lacerations:  1st degree [2]  Patient had a delivery of a Viable infant. 03/15/2018  Information for the patient's newborn:  Tracy, Church [[id number]]  Delivery Method: Vag-Spont  Physical exam  Vitals:   03/16/18 0605 03/16/18 1433 03/16/18 2120 03/17/18 0515  BP: (!) 106/59 90/77 (!) 104/56 107/66  Pulse: 66 77 79 76  Resp: 18 17  16   Temp: 98 F (36.7 C) 98 F (36.7 C) 98.5 F (36.9 C) 97.8 F (36.6 C)  TempSrc: Oral Oral Oral Oral  SpO2:  98% 99% 100%  Weight:      Height:       General: alert, cooperative and no distress Lochia: appropriate Uterine Fundus: firm Incision: N/A DVT  Evaluation: No evidence of DVT seen on physical exam. Negative Homan's sign. No cords or calf tenderness. Labs: Lab Results  Component Value Date   WBC 14.0 (H) 03/16/2018   HGB 9.9 (L) 03/16/2018   HCT 30.4 (L) 03/16/2018   MCV 96.5 03/16/2018   PLT 145 (L) 03/16/2018   CMP Latest Ref Rng & Units 09/16/2017  Glucose 70 - 99 mg/dL 99  BUN 6 - 20 mg/dL 6  Creatinine 8.41 - 6.60 mg/dL 6.30  Sodium 160 - 109 mmol/L 134(L)  Potassium 3.5 - 5.1 mmol/L 3.7  Chloride 98 - 111 mmol/L 102  CO2 22 - 32 mmol/L 23  Calcium 8.9 - 10.3 mg/dL 9.5  Total Protein 6.5 - 8.1 g/dL 6.7  Total Bilirubin 0.3 - 1.2 mg/dL 0.6  Alkaline Phos 38 - 126 U/L 35(L)  AST 15 - 41 U/L 19  ALT  0 - 44 U/L 13    Discharge instruction: per After Visit Summary and "Baby and Me Booklet".  After visit meds:  Allergies as of 03/17/2018   No Known Allergies     Medication List    STOP taking these medications   famotidine 20 MG tablet Commonly known as:  PEPCID   pantoprazole 40 MG tablet Commonly known as:  PROTONIX     TAKE these medications   hydroxyprogesterone caproate 250 mg/mL Oil injection Commonly known as:  MAKENA Inject 250 mg into the muscle once.   ibuprofen 600 MG tablet Commonly known as:  ADVIL,MOTRIN Take 1 tablet (600 mg total) by mouth every 6 (six) hours.       Diet: routine diet  Activity: Advance as tolerated. Pelvic rest for 6 weeks.   Outpatient follow up:4 weeks Follow up Appt: Future Appointments  Date Time Provider Department Center  03/17/2018  1:15 PM [facility] [place] 4 [facility]  03/21/2018  9:45 AM Levie Heritage, DO [facility] None   Follow up Visit:No follow-ups on file.  Postpartum contraception: Undecided  Newborn Data: Live born female  Birth Weight: 5 lb 15.8 oz (2715 g) APGAR: 9, 9  Newborn Delivery   Birth date/time:  03/15/2018 15:15:00 Delivery type:  Vaginal, Spontaneous     Baby Feeding: Bottle and Breast Disposition:home with  mother   03/17/2018 Myrene Buddy, MD  OB FELLOW DISCHARGE ATTESTATION  I have seen and examined this patient and agree with above documentation in the resident's note.   Gwenevere Abbot, MD  OB Fellow  03/17/2018, 9:24 AM

## 2018-03-21 ENCOUNTER — Inpatient Hospital Stay (HOSPITAL_COMMUNITY): Payer: Medicaid Other

## 2018-03-21 ENCOUNTER — Inpatient Hospital Stay (HOSPITAL_COMMUNITY)
Admission: AD | Admit: 2018-03-21 | Discharge: 2018-03-21 | Disposition: A | Discharge status: Home / Self Care | Payer: Medicaid Other | Attending: Obstetrics & Gynecology | Admitting: Obstetrics & Gynecology

## 2018-03-21 ENCOUNTER — Encounter (HOSPITAL_COMMUNITY): Payer: Self-pay

## 2018-03-21 ENCOUNTER — Encounter: Payer: Medicaid Other | Admitting: Family Medicine

## 2018-03-21 ENCOUNTER — Telehealth: Payer: Self-pay

## 2018-03-21 ENCOUNTER — Other Ambulatory Visit: Payer: Self-pay

## 2018-03-21 LAB — CBC
HCT: 35 % — ABNORMAL LOW (ref 36.0–46.0)
Hemoglobin: 11.3 g/dL — ABNORMAL LOW (ref 12.0–15.0)
MCH: 31.2 pg (ref 26.0–34.0)
MCHC: 32.3 g/dL (ref 30.0–36.0)
MCV: 96.7 fL (ref 80.0–100.0)
Platelets: 216 10*3/uL (ref 150–400)
RBC: 3.62 MIL/uL — ABNORMAL LOW (ref 3.87–5.11)
RDW: 14.1 % (ref 11.5–15.5)
WBC: 7.6 10*3/uL (ref 4.0–10.5)
nRBC: 0 % (ref 0.0–0.2)

## 2018-03-21 MED ORDER — OXYCODONE-ACETAMINOPHEN 5-325 MG PO TABS: 2.0000 | ORAL_TABLET | ORAL | 0 refills | Status: DC | PRN

## 2018-03-21 MED ORDER — MISOPROSTOL 200 MCG PO TABS: 800.0000 ug | ORAL_TABLET | Freq: Four times a day (QID) | ORAL | 0 refills | Status: DC

## 2018-03-21 MED ORDER — PROMETHAZINE HCL 25 MG PO TABS: 25.0000 mg | ORAL_TABLET | Freq: Four times a day (QID) | ORAL | 0 refills | Status: DC | PRN

## 2018-03-21 MED ORDER — IBUPROFEN 600 MG PO TABS: 600.0000 mg | ORAL_TABLET | Freq: Four times a day (QID) | ORAL | 0 refills | Status: DC

## 2018-03-21 NOTE — Discharge Instructions (Signed)
Cytotec for Pregnancy Failure FACTS YOU SHOULD KNOW CYTOTEC MANAGEMENT Prostaglandins (cytotec) are the most widely used drug for this purpose. They cause the uterus to cramp and contract. You will place the medicine yourself inside your vagina in the privacy of your home. Empting of the uterus should occur within 3 days but the process may continue for several weeks. The bleeding may seem heavy at times. POSSIBLE SIDE EFFECTS FROM CYTOTEC  Nausea   Vomiting  Diarrhea Fever  Chills  Hot Flashes Side effects  from the process of the early pregnancy failure include:  Cramping  Bleeding  Headaches  Dizziness RISKS: This is a low risk procedure. Less than 1 in 100 women has a complication. An incomplete passage of the may occur. Also, Hemorrhage (heavy bleeding) could happen.  Rarely the pregnancy will not be passed completely. Excessively heavy bleeding may occur.  Your doctor may need to perform surgery to empty the uterus (D&C). Afterwards:  You may have soreness or cramps for a day or two. You may have soreness or cramps for day or two.  You may have light bleeding for up to 2 weeks. You may be as active as you feel like being. If you have any of the following problems you may call Maternity Admissions Unit at [phone].  If you have pain that does not get better  with pain medication  Bleeding that soaks through 2 thick full-sized sanitary pads in an hour  Cramps that last longer than 2 days  Foul smelling discharge  Fever above 100.4 degrees F Even if you do not have any of these symptoms, you should have a follow-up exam to make sure you are healing properly. This appointment will be made for you before you leave the hospital.

## 2018-03-21 NOTE — Telephone Encounter (Signed)
Pt called the office stating that she is bleeding heavy and passing clots. Pt states that she started passing clots yesterday. Advised pt to go to MAU to be seen. Understanding was voiced.Tracy Church, CMA

## 2018-03-21 NOTE — MAU Provider Note (Signed)
History     CSN: [id number]  Arrival date and time: 03/21/18 1233   First Provider Initiated Contact with Patient 03/21/18 1313      Chief Complaint  Patient presents with  . Vaginal Bleeding   HPI Tracy Church is a 28 y.o. G2P1102 postpartum from a vaginal delivery on 1/11 who presents with a sudden increase in vaginal bleeding and cramping. She states it started yesterday and she passed large clots today. She denies any dizziness or lightheadedness.   OB History    Gravida  2   Para  2   Term  1   Preterm  1   AB      Living  2     SAB      TAB      Ectopic      Multiple      Live Births  2           Past Medical History:  Diagnosis Date  . Medical history non-contributory   . Preterm delivery     Past Surgical History:  Procedure Laterality Date  . NO PAST SURGERIES      Family History  Adopted: Yes    Social History   Tobacco Use  . Smoking status: Passive Smoke Exposure - Never Smoker  . Smokeless tobacco: Never Used  Substance Use Topics  . Alcohol use: Not Currently  . Drug use: Not Currently    Allergies: No Known Allergies  Facility-Administered Medications Prior to Admission  Medication Dose Route Frequency Provider Last Rate Last Dose  . HYDROXYprogesterone Caproate SOAJ 275 mg  275 mg Subcutaneous Once Levie HeritageStinson, Jacob J, DO       Medications Prior to Admission  Medication Sig Dispense Refill Last Dose  . hydroxyprogesterone caproate (MAKENA) 250 mg/mL OIL injection Inject 250 mg into the muscle once.   02/24/2018  . ibuprofen (ADVIL,MOTRIN) 600 MG tablet Take 1 tablet (600 mg total) by mouth every 6 (six) hours. 30 tablet 0     Review of Systems  Constitutional: Negative.  Negative for fatigue and fever.  HENT: Negative.   Respiratory: Negative.  Negative for shortness of breath.   Cardiovascular: Negative.  Negative for chest pain.  Gastrointestinal: Positive for abdominal pain. Negative for constipation,  diarrhea, nausea and vomiting.  Genitourinary: Positive for vaginal bleeding. Negative for dysuria and vaginal discharge.  Neurological: Negative.  Negative for dizziness and headaches.   Physical Exam   Blood pressure 126/68, height 5\' 6"  (1.676 m), weight 66.2 kg, unknown if currently breastfeeding.  Physical Exam  Nursing note and vitals reviewed. Constitutional: She is oriented to person, place, and time. She appears well-developed and well-nourished. No distress.  HENT:  Head: Normocephalic.  Eyes: Pupils are equal, round, and reactive to light.  Cardiovascular: Normal rate, regular rhythm and normal heart sounds.  Respiratory: Effort normal and breath sounds normal. No respiratory distress.  GI: Soft. Bowel sounds are normal. She exhibits no distension. There is no abdominal tenderness. There is no rebound and no guarding.  Genitourinary:    Genitourinary Comments: Small amount of vaginal bleeding   Neurological: She is alert and oriented to person, place, and time.  Skin: Skin is warm and dry.  Psychiatric: She has a normal mood and affect. Her behavior is normal. Judgment and thought content normal.    MAU Course  Procedures Results for orders placed or performed during the hospital encounter of 03/21/18 (from the past 24 hour(s))  CBC  Status: Abnormal   Collection Time: 03/21/18  1:23 PM  Result Value Ref Range   WBC 7.6 4.0 - 10.5 K/uL   RBC 3.62 (L) 3.87 - 5.11 MIL/uL   Hemoglobin 11.3 (L) 12.0 - 15.0 g/dL   HCT 93.9 (L) 03.0 - 09.2 %   MCV 96.7 80.0 - 100.0 fL   MCH 31.2 26.0 - 34.0 pg   MCHC 32.3 30.0 - 36.0 g/dL   RDW 33.0 07.6 - 22.6 %   Platelets 216 150 - 400 K/uL   nRBC 0.0 0.0 - 0.2 %   US Pelvis Complete  Result Date: 03/21/2018 CLINICAL DATA:  28 year old female status post spontaneous vaginal delivery 03/15/2018, with persistent postpartum bleeding. EXAM: TRANSABDOMINAL ULTRASOUND OF PELVIS TECHNIQUE: Transabdominal ultrasound examination of the  pelvis was performed including evaluation of the uterus, ovaries, adnexal regions, and pelvic cul-de-sac. COMPARISON:  12/20/2017 obstetric scan. FINDINGS: Uterus Measurements: 13.1 x 7.8 x 8.5 cm = volume: 450 mL. Enlarged anteverted postpartum uterus. No uterine fibroids or other myometrial abnormalities. Endometrium Thickness: 33 mm. Thickened heterogeneous endometrium with mild internal vascularity anteriorly on color Doppler. No focal endometrial mass. Right ovary Nonvisualization of the right ovary on transabdominal scan. No right adnexal mass. Left ovary Nonvisualization of the left ovary on transabdominal scan. No left adnexal mass. Other findings:  No abnormal free fluid. IMPRESSION: 1. Thickened (33 mm) heterogeneous endometrium with mild endometrial vascularity on color Doppler. No focal endometrial mass. Findings are compatible with retained products of conception. 2. Nonvisualization of the ovaries.  No adnexal masses. Electronically Signed   By: Delbert Phenix M.D.   On: 03/21/2018 14:13   MDM CBC US Pelvic complete  Consulted with Dr. Debroah Loop- will give patient cytotec for retained POC. Patient extensively counseled on bleeding and pain precautions. Reviewed when to return to MAU.   Assessment and Plan   1. Retained products of conception after delivery without hemorrhage    -Discharge home in stable condition -Rx for cytotec, percocet, ibuprofen and phenergan given to patient -Patient advised to follow-up with [facility] in 1 week, message sent -Patient may return to MAU as needed or if her condition were to change or worsen  Rolm Bookbinder CNM 03/21/2018, 1:13 PM

## 2018-03-21 NOTE — MAU Note (Signed)
Pt presents to MAU with c/o vaginal bleeding post vaginal delivery that was on 03/15/2018, no complications. She did have tear that was repaired. Pt states the bleeding had decreased and then increased again yesterday, passed multiple clots (quarter sized to baseball sized clots). She has also had intermittent abdominal cramping.

## 2018-03-24 ENCOUNTER — Telehealth: Payer: Self-pay | Admitting: *Deleted

## 2018-03-24 NOTE — Telephone Encounter (Signed)
Tracy Church called and left a message on voicemail 03/22/18 stating she came to ER yesterday ( 03/21/18) and got a prescription that states take 4 tablets mid morning, dinner , bedtime but only had 4 tablets. States pharmacy told her to call doctor. Per chart is a patient of [facility]  Will forward

## 2018-03-24 NOTE — Telephone Encounter (Signed)
Discussed patient case with Dr. Erin Fulling.  Per Dr. Erin Fulling she can take 800mg  cytotec per vagina today. Patient is to call us if she does not have an increase in bleeding or cramping.   Patient has follow up scheduled for Friday 03/28/2018. Patient to also call me and let me know if she develops fever, chills,or malaise. Armandina Stammer RN

## 2018-03-28 ENCOUNTER — Ambulatory Visit (INDEPENDENT_AMBULATORY_CARE_PROVIDER_SITE_OTHER): Payer: Medicaid Other | Admitting: Family Medicine

## 2018-03-28 ENCOUNTER — Encounter: Payer: Self-pay | Admitting: Family Medicine

## 2018-03-28 NOTE — Progress Notes (Signed)
   Subjective:    Patient ID: Tracy Church, female    DOB: 12/04/1990, 28 y.o.   MRN: [id number]  HPI Patient seen for follow-up of retained products of conception.  Patient used Cytotec and had some tissue that she passed with blood clots.  Bleeding is pretty minimal at this point.  Does have some vaginal irritation on the right side where she has a stitch.   Review of Systems     Objective:   Physical Exam Cardiovascular:     Rate and Rhythm: Normal rate and regular rhythm.     Heart sounds: No murmur. No friction rub. No gallop.   Pulmonary:     Effort: Pulmonary effort is normal. No respiratory distress.     Breath sounds: Normal breath sounds. No wheezing or rales.  Abdominal:     General: Abdomen is flat. There is no distension.     Palpations: Abdomen is soft. There is no mass.     Tenderness: There is no abdominal tenderness. There is no guarding or rebound.     Hernia: No hernia is present.  Genitourinary:         Assessment & Plan:  1. Retained products of conception after delivery without hemorrhage Resolved.  Stitch removed.  Follow-up in 2 weeks.  Laceration appears well-healed

## 2018-04-10 ENCOUNTER — Ambulatory Visit: Payer: Medicaid Other | Admitting: Family Medicine

## 2018-04-17 ENCOUNTER — Ambulatory Visit: Payer: Medicaid Other | Admitting: Obstetrics & Gynecology

## 2018-05-09 ENCOUNTER — Ambulatory Visit (INDEPENDENT_AMBULATORY_CARE_PROVIDER_SITE_OTHER): Payer: Medicaid Other | Admitting: Family Medicine

## 2018-05-09 ENCOUNTER — Encounter: Payer: Self-pay | Admitting: Family Medicine

## 2018-05-09 ENCOUNTER — Ambulatory Visit (HOSPITAL_BASED_OUTPATIENT_CLINIC_OR_DEPARTMENT_OTHER)
Admission: RE | Admit: 2018-05-09 | Discharge: 2018-05-09 | Disposition: A | Discharge status: Home / Self Care | Payer: Medicaid Other | Source: Ambulatory Visit | Attending: Family Medicine | Admitting: Family Medicine

## 2018-05-09 DIAGNOSIS — Z1389 Encounter for screening for other disorder: Secondary | ICD-10-CM | POA: Diagnosis not present

## 2018-05-09 NOTE — Progress Notes (Signed)
Subjective:     Tracy Church is a 28 y.o. female who presents for a postpartum visit. She is 7 weeks postpartum following a spontaneous vaginal delivery. I have fully reviewed the prenatal and intrapartum course. The delivery was at 38 gestational weeks. Outcome: spontaneous vaginal delivery. Anesthesia: none. Postpartum course has been complicated by retained POC. Baby's course has been normal. Baby is feeding by breast. Bleeding no bleeding and red. Bowel function is normal. Bladder function is normal. Patient is not sexually active. Contraception method is undecided. Postpartum depression screening: negative.  The following portions of the patient's history were reviewed and updated as appropriate: allergies, current medications, past family history, past medical history, past social history, past surgical history and problem list.  Review of Systems Pertinent items are noted in HPI.   Objective:    There were no vitals taken for this visit.  General:  alert, cooperative and no distress  Lungs: clear to auscultation bilaterally  Heart:  regular rate and rhythm, S1, S2 normal, no murmur, click, rub or gallop  Abdomen: soft, non-tender; bowel sounds normal; no masses,  no organomegaly   Vulva:  normal  Vagina: normal vagina, no discharge, exudate, lesion, or erythema  Cervix:  multiparous appearance and no lesions  Corpus: normal size, contour, position, consistency, mobility, non-tender  Adnexa:  normal adnexa and no mass, fullness, tenderness        Assessment:     Normal postpartum exam. Excessive postpartum bleeding    Plan:    1. Contraception: pending resolution of bleeding 2. Will get [place] to r/o further retained POC. If normal, then will place on POPs. 3. Follow up in: 2 months or as needed.

## 2018-05-12 ENCOUNTER — Other Ambulatory Visit: Payer: Self-pay | Admitting: Family Medicine

## 2018-05-12 MED ORDER — NORETHINDRONE 0.35 MG PO TABS: 1.0000 | ORAL_TABLET | Freq: Every day | ORAL | 11 refills | Status: DC

## 2018-07-11 ENCOUNTER — Ambulatory Visit: Payer: Medicaid Other | Admitting: Family Medicine

## 2019-05-17 ENCOUNTER — Encounter (HOSPITAL_COMMUNITY): Payer: Self-pay | Admitting: Emergency Medicine

## 2019-05-17 ENCOUNTER — Emergency Department (HOSPITAL_COMMUNITY)
Admission: EM | Admit: 2019-05-17 | Discharge: 2019-05-17 | Disposition: A | Discharge status: Home / Self Care | Payer: Medicaid Other | Attending: Emergency Medicine | Admitting: Emergency Medicine

## 2019-05-17 ENCOUNTER — Other Ambulatory Visit: Payer: Self-pay

## 2019-05-17 DIAGNOSIS — U071 COVID-19: Secondary | ICD-10-CM | POA: Insufficient documentation

## 2019-05-17 DIAGNOSIS — Z20822 Contact with and (suspected) exposure to covid-19: Secondary | ICD-10-CM

## 2019-05-17 DIAGNOSIS — R05 Cough: Secondary | ICD-10-CM | POA: Diagnosis not present

## 2019-05-17 DIAGNOSIS — R5383 Other fatigue: Secondary | ICD-10-CM | POA: Diagnosis not present

## 2019-05-17 DIAGNOSIS — R0982 Postnasal drip: Secondary | ICD-10-CM | POA: Diagnosis not present

## 2019-05-17 DIAGNOSIS — R519 Headache, unspecified: Secondary | ICD-10-CM | POA: Diagnosis not present

## 2019-05-17 DIAGNOSIS — R6883 Chills (without fever): Secondary | ICD-10-CM | POA: Diagnosis present

## 2019-05-17 DIAGNOSIS — Z7722 Contact with and (suspected) exposure to environmental tobacco smoke (acute) (chronic): Secondary | ICD-10-CM | POA: Diagnosis not present

## 2019-05-17 LAB — POC SARS CORONAVIRUS 2 AG -  ED: SARS Coronavirus 2 Ag: NEGATIVE

## 2019-05-17 NOTE — ED Provider Notes (Signed)
[facility] EMERGENCY DEPARTMENT Provider Note   CSN: [id number] Arrival date & time: 05/17/19  1742     History Chief Complaint  Patient presents with  . Chills  . Headache  . Generalized Body Aches    Tracy Church is a 29 y.o. female who presents today for evaluation of 3 days of chills, headache, cough, postnasal drip, and fatigue.  She does not have any known coronavirus contacts however does have children in school. She reports that she uses a nasal steroid spray for her allergies and started that today however has not yet had any relief.  She denies any abdominal pain, chest pain, or shortness of breath.  No nausea, vomiting, or diarrhea.  She reports overall just feeling unwell with occasional body aches.   She denies dysuria, increased frequency or urgency.  No abnormal vaginal discharge.  She denies any wounds, rashes, or tick bites.  She denies any concern for sexually transmitted infection or pelvic pain.  She denies any possibility of pregnancy stating she is currently menstruating.  HPI     Past Medical History:  Diagnosis Date  . Medical history non-contributory   . Preterm delivery     Patient Active Problem List   Diagnosis Date Noted  . Pregnancy 03/15/2018  . Trichomonas infection 02/11/2018  . History of preterm delivery 09/24/2017  . Supervision of high risk pregnancy, antepartum 09/24/2017  . History of prior pregnancy with short cervix, currently pregnant 09/24/2017    Past Surgical History:  Procedure Laterality Date  . NO PAST SURGERIES       OB History    Gravida  2   Para  2   Term  1   Preterm  1   AB      Living  2     SAB      TAB      Ectopic      Multiple      Live Births  2           Family History  Adopted: Yes    Social History   Tobacco Use  . Smoking status: Passive Smoke Exposure - Never Smoker  . Smokeless tobacco: Never Used  Substance Use Topics  . Alcohol use: Not  Currently  . Drug use: Not Currently    Home Medications Prior to Admission medications   Medication Sig Start Date End Date Taking? Authorizing Provider  norethindrone (MICRONOR,CAMILA,ERRIN) 0.35 MG tablet Take 1 tablet (0.35 mg total) by mouth daily. Patient not taking: Reported on 05/17/2019 05/12/18   Levie Heritage, DO    Allergies    Patient has no known allergies.  Review of Systems   Review of Systems  Constitutional: Positive for chills and fatigue. Negative for fever.  HENT: Positive for postnasal drip and sneezing. Negative for congestion, drooling, tinnitus and trouble swallowing.   Respiratory: Positive for cough. Negative for shortness of breath.   Gastrointestinal: Negative for abdominal pain, constipation and vomiting.  Genitourinary: Negative for dysuria, frequency, pelvic pain and vaginal discharge.  Musculoskeletal: Positive for myalgias.  Neurological: Positive for headaches. Negative for weakness.  All other systems reviewed and are negative.   Physical Exam Updated Vital Signs BP 125/72 (BP Location: Right Arm)   Pulse 82   Temp 99.1 F (37.3 C) (Oral)   Resp 16   LMP 05/12/2019   SpO2 100%   Physical Exam Vitals and nursing note reviewed.  Constitutional:      General: She  is not in acute distress.    Appearance: She is well-developed. She is not diaphoretic.  HENT:     Head: Normocephalic and atraumatic.     Jaw: No trismus.     Right Ear: Tympanic membrane, ear canal and external ear normal.     Left Ear: Tympanic membrane, ear canal and external ear normal.     Nose: Mucosal edema present.     Right Sinus: Frontal sinus tenderness present.     Left Sinus: Frontal sinus tenderness present.     Mouth/Throat:     Mouth: Mucous membranes are moist.     Pharynx: Oropharynx is clear. Uvula midline. No oropharyngeal exudate or posterior oropharyngeal erythema.  Eyes:     General: No scleral icterus.       Right eye: No discharge.        Left  eye: No discharge.     Conjunctiva/sclera: Conjunctivae normal.  Cardiovascular:     Rate and Rhythm: Normal rate and regular rhythm.     Heart sounds: Normal heart sounds. No murmur.  Pulmonary:     Effort: Pulmonary effort is normal. No respiratory distress.     Breath sounds: Normal breath sounds. No stridor.  Abdominal:     General: There is no distension.     Palpations: Abdomen is soft.  Musculoskeletal:        General: No deformity.     Cervical back: Full passive range of motion without pain, normal range of motion and neck supple. No pain with movement.  Lymphadenopathy:     Cervical: No cervical adenopathy.  Skin:    General: Skin is warm and dry.  Neurological:     Mental Status: She is alert and oriented to person, place, and time.     Motor: No abnormal muscle tone.  Psychiatric:        Behavior: Behavior normal.     ED Results / Procedures / Treatments   Labs (all labs ordered are listed, but only abnormal results are displayed) Labs Reviewed  SARS CORONAVIRUS 2 (TAT 6-24 HRS)  POC SARS CORONAVIRUS 2 AG -  ED    EKG None  Radiology No results found.  Procedures Procedures (including critical care time)  Medications Ordered in ED Medications - No data to display  ED Course  I have reviewed the triage vital signs and the nursing notes.  Pertinent labs & imaging results that were available during my care of the patient were reviewed by me and considered in my medical decision making (see chart for details).    MDM Rules/Calculators/A&P                      Patient is an otherwise healthy 30 year old woman who presents today for evaluation of 3 days of headache, fatigue, cough, chills, body aches and generally feeling unwell. Here she is afebrile, not tachycardic or tachypneic and generally well-appearing in no obvious distress.  She does report headache, however she has tenderness to percussion over the frontal sinuses where she reports her head  hurts.  No meningismus or neck rigidity.  No vision changes and she is neurologically intact, do not suspect meningitis.  She denies symptoms that would be consistent with UTI, serious intra-abdominal cause, or tickborne cause of her symptoms.  Her symptoms are consistent with coronavirus. Antigen test is negative however PCR testing is sent.  She is given a work note and instructed to keep her children home from school until her  test results return. I also suspect that she may have an element of allergies playing into her symptoms.  Recommended that she continue using the nasal steroid spray in addition to adding Allegra, Claritin, or Zyrtec to her regimen.  Return precautions were discussed with patient who states their understanding.  At the time of discharge patient denied any unaddressed complaints or concerns.  Patient is agreeable for discharge home.  Note: Portions of this report may have been transcribed using voice recognition software. Every effort was made to ensure accuracy; however, inadvertent computerized transcription errors may be present   Tracy Church was evaluated in Emergency Department on 05/18/2019 for the symptoms described in the history of present illness. She was evaluated in the context of the global COVID-19 pandemic, which necessitated consideration that the patient might be at risk for infection with the SARS-CoV-2 virus that causes COVID-19. Institutional protocols and algorithms that pertain to the evaluation of patients at risk for COVID-19 are in a state of rapid change based on information released by regulatory bodies including the CDC and federal and state organizations. These policies and algorithms were followed during the patient's care in the ED.  Final Clinical Impression(s) / ED Diagnoses Final diagnoses:  Suspected COVID-19 virus infection    Rx / DC Orders ED Discharge Orders    None       Ollen Gross 05/18/19 0019     Nat Christen, MD 05/18/19 [phone]

## 2019-05-17 NOTE — ED Notes (Signed)
Informed [facility] covid test neg

## 2019-05-17 NOTE — ED Triage Notes (Signed)
C/o chills, headache, and body aches x 3 days.  No known COVID exposures.

## 2019-05-17 NOTE — Discharge Instructions (Addendum)
As we discussed your symptoms are most likely caused by coronavirus.  You have a additional test pending, you can follow those results in your MyChart. Please take Tylenol first to control any symptoms.  You may take 2 extra strength Tylenol every 8 hours as directed below.  If that does not work then you may add in ibuprofen if needed.  Please also start taking Allegra, Claritin, or Zyrtec in addition to using your nose spray to help with any allergies. If your symptoms worsen, you develop any new or concerning symptoms or have other concerns please seek additional medical care and evaluation.  Please take Ibuprofen (Advil, motrin) and Tylenol (acetaminophen) to relieve your pain.  You may take up to 600 MG (3 pills) of normal strength ibuprofen every 8 hours as needed.  In between doses of ibuprofen you make take tylenol, up to 1,000 mg (two extra strength pills).  Do not take more than 3,000 mg tylenol in a 24 hour period.  Please check all medication labels as many medications such as pain and cold medications may contain tylenol.  Do not drink alcohol while taking these medications.  Do not take other NSAID'S while taking ibuprofen (such as aleve or naproxen).  Please take ibuprofen with food to decrease stomach upset.

## 2019-05-18 ENCOUNTER — Telehealth (HOSPITAL_COMMUNITY): Payer: Self-pay

## 2019-05-18 LAB — SARS CORONAVIRUS 2 (TAT 6-24 HRS): SARS Coronavirus 2: POSITIVE — AB

## 2020-01-06 IMAGING — US US MFM OB TRANSVAGINAL
1 series · 15 of 16 positions shown · non-contrast
Comparison: none

[Series 1: us mfm ob transvaginal · 16 acquisitions, 15 frames shown]
[im 1/16]
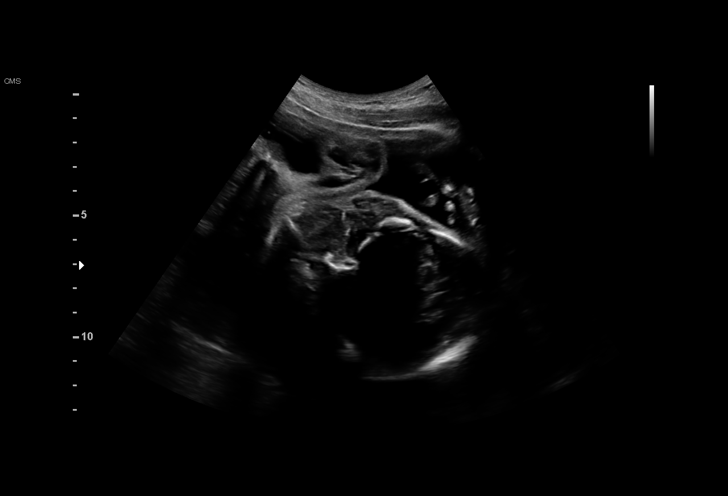
[im 2/16]
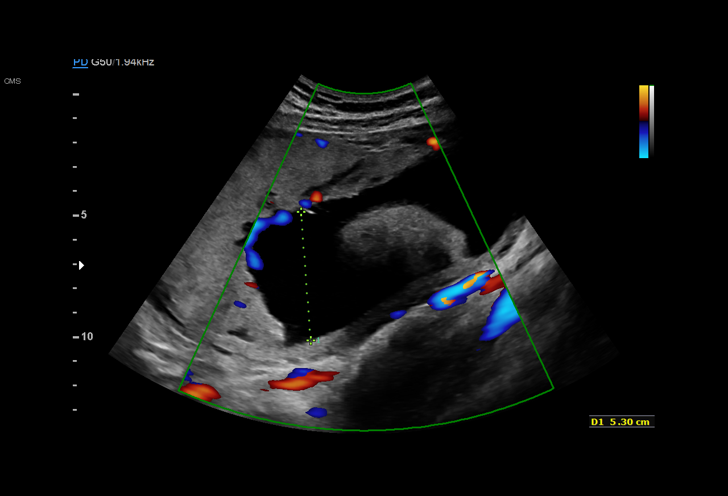
[im 3/16]
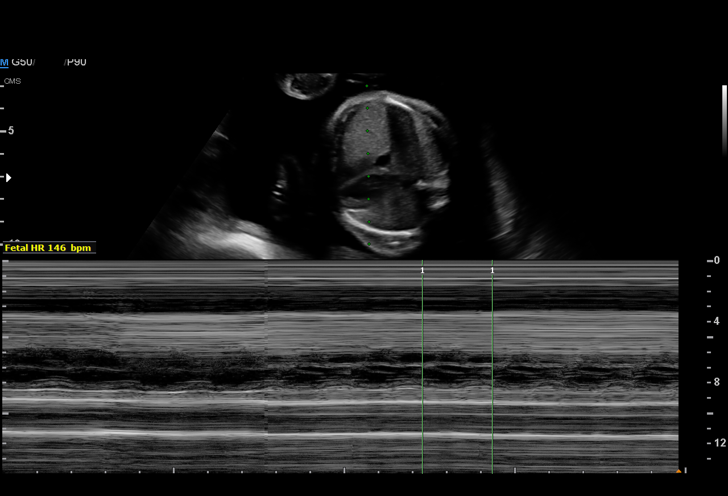
[im 4/16]
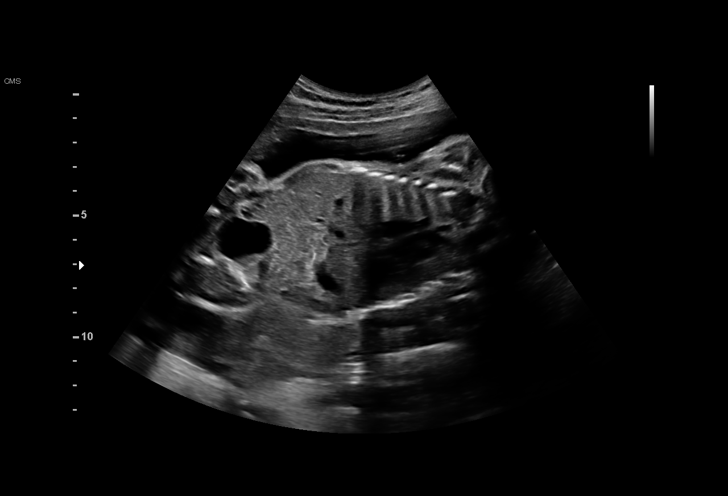
[im 5/16]
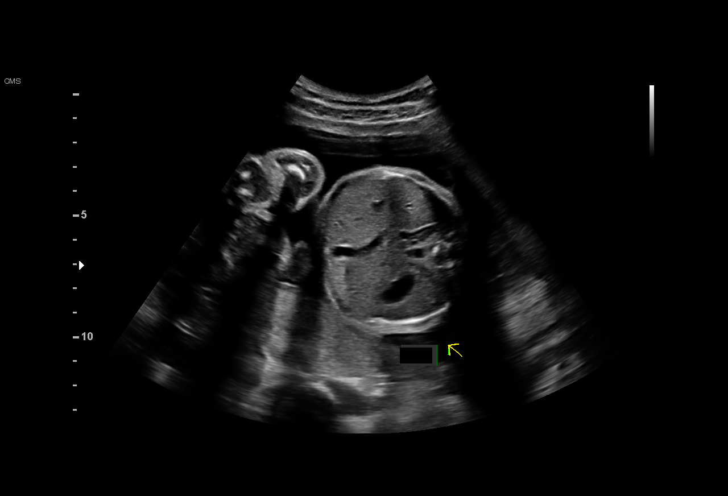
[im 6/16]
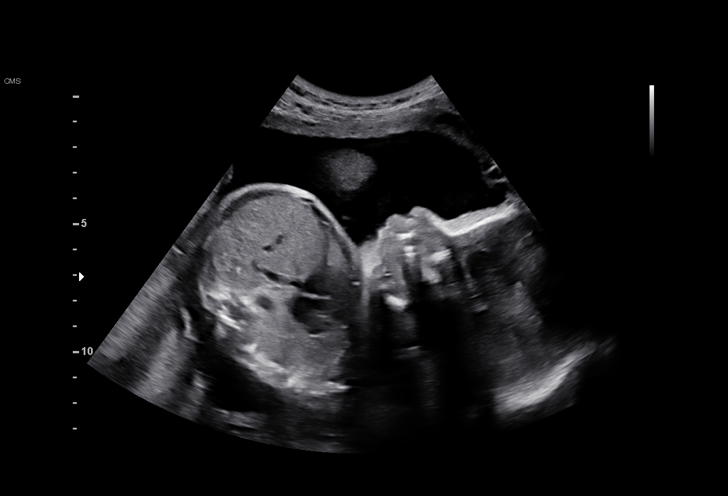
[im 7/16]
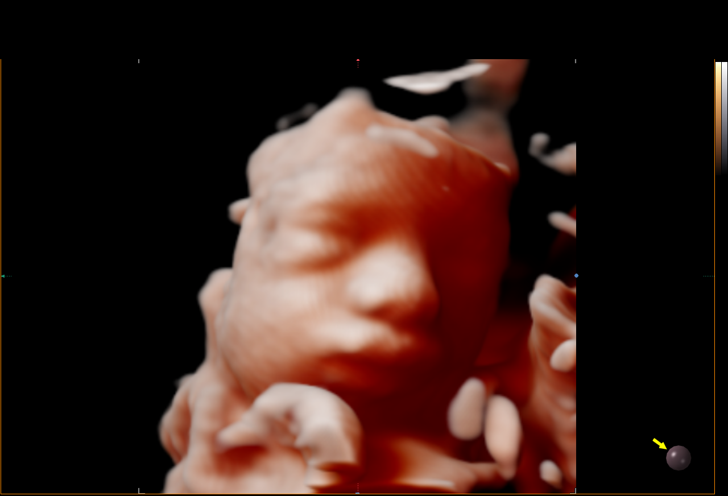
[im 9/16]
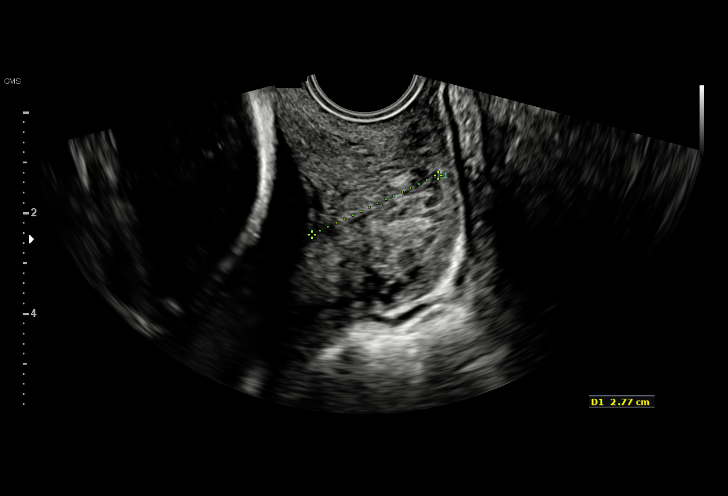
[im 10/16]
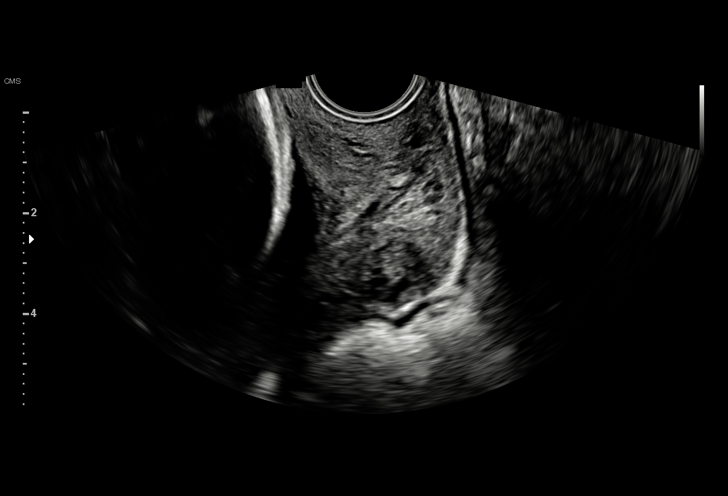
[im 11/16]
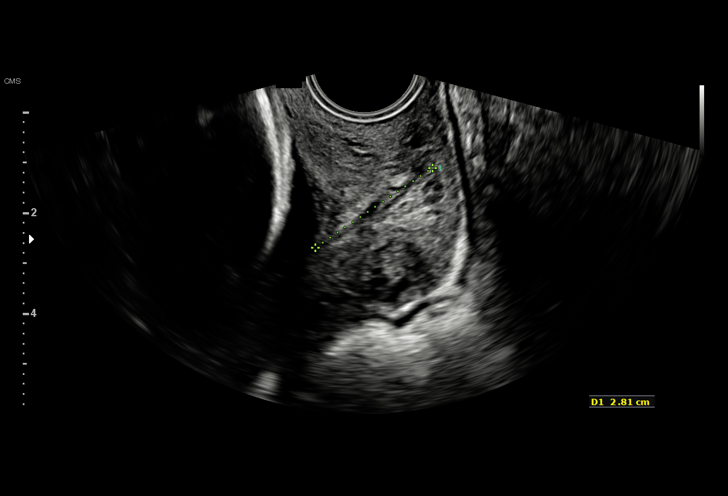
[im 12/16]
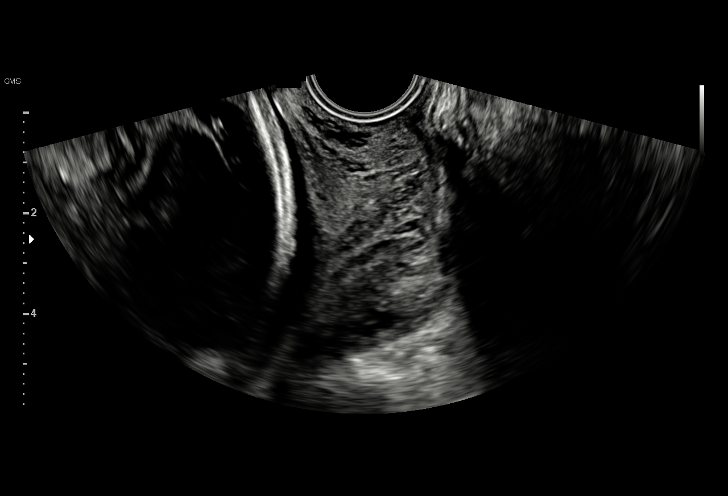
[im 13/16]
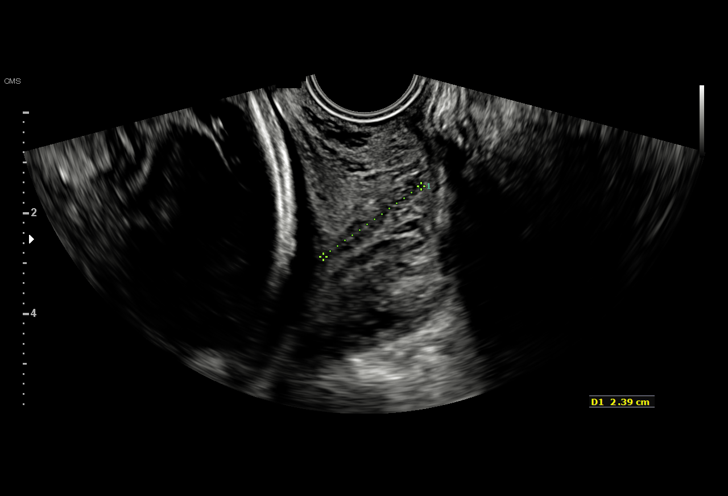
[im 14/16]
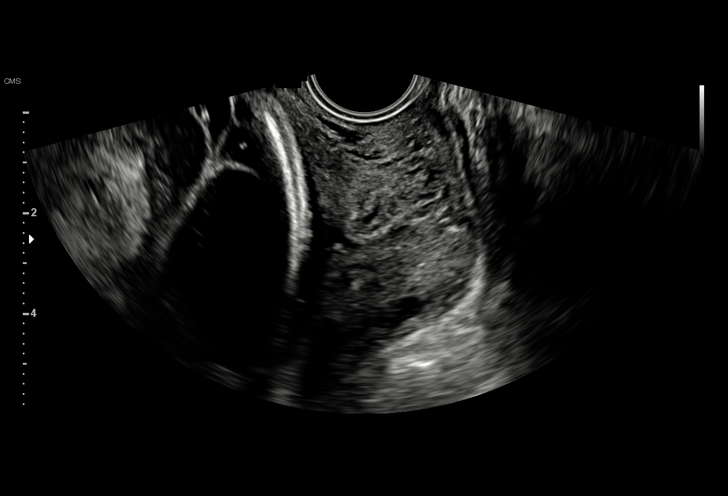
[im 15/16]
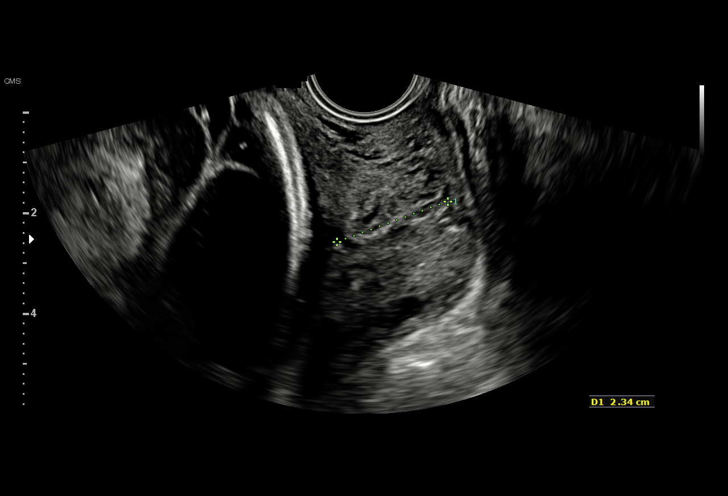
[im 16/16]
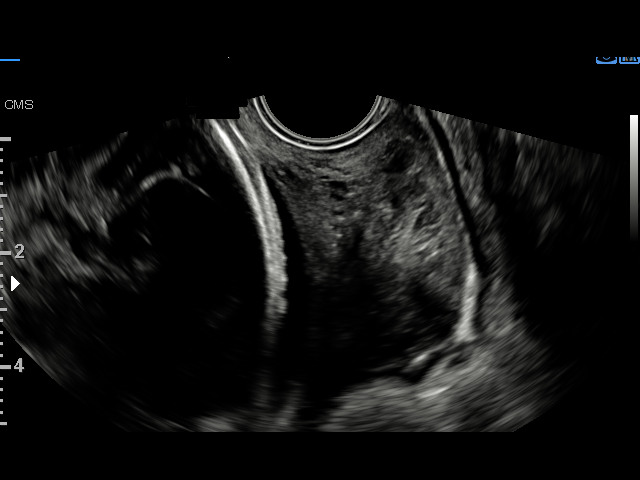

[15 of 16 positions shown; findings below may reference images not displayed]

[REDACTED]

Indications

Poor obstetric history: Previous preterm
delivery @ 80wks, - on 17P
Encounter for cervical length
26 weeks gestation of pregnancy
Vital Signs

Height:        5'6"
Fetal Evaluation

Num Of Fetuses:         1
Fetal Heart Rate(bpm):  146
Cardiac Activity:       Observed
Presentation:           Cephalic

Amniotic Fluid
AFI FV:      Within normal limits

Largest Pocket(cm)
5.3
OB History

Gravidity:    2         Term:   0        Prem:   1
Living:       1
Gestational Age

Best:          26w 2d     Det. By:  Previous Ultrasound      EDD:   03/26/18
Cervix Uterus Adnexa

Cervix
Length:            2.3  cm.
Measured transvaginally.
Impression

Patient with history of preterm delivery returns for cervical
length measurement. She takes weekly 17-OH progesterone
prophylaxis. She does not have symptoms of uterine
contractions or vaginal discharge.

Amniotic fluid is normal and good fetal activity is seen. On
transvaginal scan, shortest cervical length measurement is
2.3 cm, which is normal. No further shortening or funneling is
seen on transfundal pressure.

We reassured the patient of the findings.
Recommendations

Follow-up scans as clinically indicated.

## 2020-03-13 ENCOUNTER — Ambulatory Visit
Admission: EM | Admit: 2020-03-13 | Discharge: 2020-03-13 | Disposition: A | Discharge status: Home / Self Care | Payer: Medicaid Other | Attending: Emergency Medicine | Admitting: Emergency Medicine

## 2020-03-13 ENCOUNTER — Other Ambulatory Visit: Payer: Self-pay

## 2020-03-13 DIAGNOSIS — J069 Acute upper respiratory infection, unspecified: Secondary | ICD-10-CM

## 2020-03-13 DIAGNOSIS — R350 Frequency of micturition: Secondary | ICD-10-CM | POA: Insufficient documentation

## 2020-03-13 LAB — POCT URINALYSIS DIP (MANUAL ENTRY)
Bilirubin, UA: NEGATIVE
Glucose, UA: NEGATIVE mg/dL
Ketones, POC UA: NEGATIVE mg/dL
Leukocytes, UA: NEGATIVE
Nitrite, UA: NEGATIVE
Protein Ur, POC: NEGATIVE mg/dL
Spec Grav, UA: >=1.03 — AB (ref 1.010–1.025)
Urobilinogen, UA: 0.2 E.U./dL
pH, UA: 7 (ref 5.0–8.0)

## 2020-03-13 LAB — POCT URINE PREGNANCY: Preg Test, Ur: NEGATIVE

## 2020-03-13 MED ORDER — ONDANSETRON 4 MG PO TBDP: 4.0000 mg | ORAL_TABLET | Freq: Three times a day (TID) | ORAL | 0 refills | Status: DC | PRN

## 2020-03-13 MED ORDER — FLUTICASONE PROPIONATE 50 MCG/ACT NA SUSP: 1.0000 | Freq: Every day | NASAL | 0 refills | Status: DC

## 2020-03-13 MED ORDER — DM-GUAIFENESIN ER 30-600 MG PO TB12: 1.0000 | ORAL_TABLET | Freq: Two times a day (BID) | ORAL | 0 refills | Status: DC

## 2020-03-13 NOTE — ED Triage Notes (Signed)
Patient presents to Urgent Care with complaints of dry cough, nausea, and increased urine freq. X 2 days.    Denies fever, abdominal pain.

## 2020-03-13 NOTE — Discharge Instructions (Addendum)
Covid test pending Vaginal swab pending Flonase and Zyrtec daily for congestion and drainage May use Mucinex DM for further relief of cough and congestion Zofran as needed for nausea Drink plenty of fluids Follow-up if not improving or worsening

## 2020-03-13 NOTE — ED Provider Notes (Signed)
[name]    CSN: [id number] Arrival date & time: 03/13/20  0857      History   Chief Complaint Chief Complaint  Patient presents with  . Cough  . Nausea    HPI Tracy Church is a 30 y.o. female presenting today for evaluation of URI symptoms.  Reports cough nausea and sinus congestion and pressure.  Reports symptoms began over the past 2 to 3 days.  She has also reported some intermittent nausea and increased urinary frequency.  Denies dysuria, vaginal discharge itching or irritation.  Children with similar symptoms.  HPI  Past Medical History:  Diagnosis Date  . Medical history non-contributory   . Preterm delivery     Patient Active Problem List   Diagnosis Date Noted  . Pregnancy 03/15/2018  . Trichomonas infection 02/11/2018  . History of preterm delivery 09/24/2017  . Supervision of high risk pregnancy, antepartum 09/24/2017  . History of prior pregnancy with short cervix, currently pregnant 09/24/2017    Past Surgical History:  Procedure Laterality Date  . NO PAST SURGERIES      OB History    Gravida  2   Para  2   Term  1   Preterm  1   AB      Living  2     SAB      IAB      Ectopic      Multiple      Live Births  2            Home Medications    Prior to Admission medications   Medication Sig Start Date End Date Taking? Authorizing Provider  dextromethorphan-guaiFENesin (MUCINEX DM) 30-600 MG 12hr tablet Take 1 tablet by mouth 2 (two) times daily. 03/13/20  Yes Jasai Sorg C, PA-C  fluticasone (FLONASE) 50 MCG/ACT nasal spray Place 1-2 sprays into both nostrils daily. 03/13/20  Yes Yadier Bramhall C, PA-C  ondansetron (ZOFRAN ODT) 4 MG disintegrating tablet Take 1 tablet (4 mg total) by mouth every 8 (eight) hours as needed for nausea or vomiting. 03/13/20  Yes Aristeo Hankerson C, PA-C  norethindrone (MICRONOR,CAMILA,ERRIN) 0.35 MG tablet Take 1 tablet (0.35 mg total) by mouth daily. Patient not taking:  Reported on 05/17/2019 05/12/18 03/13/20  Levie Heritage, DO    Family History Family History  Adopted: Yes  Problem Relation Age of Onset  . Healthy Mother   . Healthy Father     Social History Social History   Tobacco Use  . Smoking status: Passive Smoke Exposure - Never Smoker  . Smokeless tobacco: Never Used  Vaping Use  . Vaping Use: Former  Substance Use Topics  . Alcohol use: Not Currently  . Drug use: Not Currently     Allergies   Patient has no known allergies.   Review of Systems Review of Systems  Constitutional: Negative for activity change, appetite change, chills, fatigue and fever.  HENT: Positive for congestion, rhinorrhea and sinus pressure. Negative for ear pain, sore throat and trouble swallowing.   Eyes: Negative for discharge and redness.  Respiratory: Positive for cough. Negative for chest tightness and shortness of breath.   Cardiovascular: Negative for chest pain.  Gastrointestinal: Positive for nausea. Negative for abdominal pain, diarrhea and vomiting.  Genitourinary: Positive for frequency. Negative for urgency.  Musculoskeletal: Negative for myalgias.  Skin: Negative for rash.  Neurological: Negative for dizziness, light-headedness and headaches.     Physical Exam Triage Vital Signs ED Triage Vitals  Enc  Vitals Group     BP 03/13/20 0952 107/69     Pulse Rate 03/13/20 0952 61     Resp 03/13/20 0952 16     Temp 03/13/20 0952 (!) 97.5 F (36.4 C)     Temp Source 03/13/20 0952 Temporal     SpO2 03/13/20 0952 98 %     Weight 03/13/20 0950 176 lb (79.8 kg)     Height --      Head Circumference --      Peak Flow --      Pain Score 03/13/20 0950 0     Pain Loc --      Pain Edu? --      Excl. in GC? --    No data found.  Updated Vital Signs BP 107/69 (BP Location: Right Arm)   Pulse 61   Temp (!) 97.5 F (36.4 C) (Temporal)   Resp 16   Wt 176 lb (79.8 kg)   LMP 03/10/2020   SpO2 98%   BMI 28.41 kg/m   Visual Acuity Right  Eye Distance:   Left Eye Distance:   Bilateral Distance:    Right Eye Near:   Left Eye Near:    Bilateral Near:     Physical Exam Vitals and nursing note reviewed.  Constitutional:      Appearance: She is well-developed and well-nourished.     Comments: No acute distress  HENT:     Head: Normocephalic and atraumatic.     Ears:     Comments: Bilateral ears without tenderness to palpation of external auricle, tragus and mastoid, EAC's without erythema or swelling, TM's with good bony landmarks and cone of light. Non erythematous.     Nose: Nose normal.     Mouth/Throat:     Comments: Oral mucosa pink and moist, no tonsillar enlargement or exudate. Posterior pharynx patent and nonerythematous, no uvula deviation or swelling. Normal phonation. Eyes:     Conjunctiva/sclera: Conjunctivae normal.  Cardiovascular:     Rate and Rhythm: Normal rate.  Pulmonary:     Effort: Pulmonary effort is normal. No respiratory distress.     Comments: Breathing comfortably at rest, CTABL, no wheezing, rales or other adventitious sounds auscultated Abdominal:     General: There is no distension.  Musculoskeletal:        General: Normal range of motion.     Cervical back: Neck supple.  Skin:    General: Skin is warm and dry.  Neurological:     Mental Status: She is alert and oriented to person, place, and time.  Psychiatric:        Mood and Affect: Mood and affect normal.      UC Treatments / Results  Labs (all labs ordered are listed, but only abnormal results are displayed) Labs Reviewed  POCT URINALYSIS DIP (MANUAL ENTRY) - Abnormal; Notable for the following components:      Result Value   Spec Grav, UA >=1.030 (*)    Blood, UA large (*)    All other components within normal limits  NOVEL CORONAVIRUS, NAA  POCT URINE PREGNANCY  CERVICOVAGINAL ANCILLARY ONLY    EKG   Radiology No results found.  Procedures Procedures (including critical care time)  Medications Ordered in  UC Medications - No data to display  Initial Impression / Assessment and Plan / UC Course  I have reviewed the triage vital signs and the nursing notes.  Pertinent labs & imaging results that were available during my care of  the patient were reviewed by me and considered in my medical decision making (see chart for details).     1.  Viral URI with cough-Covid test pending, continue symptomatic and supportive care rest and fluids  2.  Urinary frequency-UA not suggestive of UTI, vaginal swab pending.  Discussed strict return precautions. Patient verbalized understanding and is agreeable with plan.  Final Clinical Impressions(s) / UC Diagnoses   Final diagnoses:  Viral URI with cough  Urinary frequency     Discharge Instructions     Covid test pending Vaginal swab pending Flonase and Zyrtec daily for congestion and drainage May use Mucinex DM for further relief of cough and congestion Zofran as needed for nausea Drink plenty of fluids Follow-up if not improving or worsening    ED Prescriptions    Medication Sig Dispense Auth. Provider   fluticasone (FLONASE) 50 MCG/ACT nasal spray Place 1-2 sprays into both nostrils daily. 16 g Eyal Greenhaw C, PA-C   dextromethorphan-guaiFENesin (MUCINEX DM) 30-600 MG 12hr tablet Take 1 tablet by mouth 2 (two) times daily. 20 tablet Rohit Deloria C, PA-C   ondansetron (ZOFRAN ODT) 4 MG disintegrating tablet Take 1 tablet (4 mg total) by mouth every 8 (eight) hours as needed for nausea or vomiting. 20 tablet Bexley Laubach, [place] C, PA-C     PDMP not reviewed this encounter.   Lew Dawes, [place] 03/13/20 1107

## 2020-03-15 LAB — CERVICOVAGINAL ANCILLARY ONLY
Bacterial Vaginitis (gardnerella): POSITIVE — AB
Candida Glabrata: NEGATIVE
Candida Vaginitis: POSITIVE — AB
Chlamydia: POSITIVE — AB
Comment: NEGATIVE
Comment: NEGATIVE
Comment: NEGATIVE
Comment: NEGATIVE
Comment: NEGATIVE
Comment: NORMAL
Neisseria Gonorrhea: NEGATIVE
Trichomonas: NEGATIVE

## 2020-03-16 ENCOUNTER — Telehealth (HOSPITAL_COMMUNITY): Payer: Self-pay

## 2020-03-16 MED ORDER — AZITHROMYCIN 250 MG PO TABS: 1000.0000 mg | ORAL_TABLET | Freq: Every day | ORAL | 0 refills | Status: AC

## 2020-03-16 MED ORDER — FLUCONAZOLE 150 MG PO TABS: 150.0000 mg | ORAL_TABLET | Freq: Every day | ORAL | 0 refills | Status: AC

## 2020-03-16 MED ORDER — METRONIDAZOLE 500 MG PO TABS: 500.0000 mg | ORAL_TABLET | Freq: Two times a day (BID) | ORAL | 0 refills | Status: DC

## 2020-03-17 LAB — NOVEL CORONAVIRUS, NAA: SARS-CoV-2, NAA: NOT DETECTED

## 2020-06-04 IMAGING — US US OB COMP LESS 14 WK
1 series · 14 of 28 positions shown · non-contrast
Comparison: None.

CLINICAL DATA: Cramping and spotting x3 days.

EXAM:
OBSTETRIC <14 WK ULTRASOUND
TECHNIQUE: Transvaginal ultrasound was performed for complete evaluation of the
gestation as well as the maternal uterus, adnexal regions, and
pelvic cul-de-sac.

[Series 1: us ob comp less 14 wk · 0.25mm/px · 28 acquisitions, 14 frames shown]
[im 2/28]
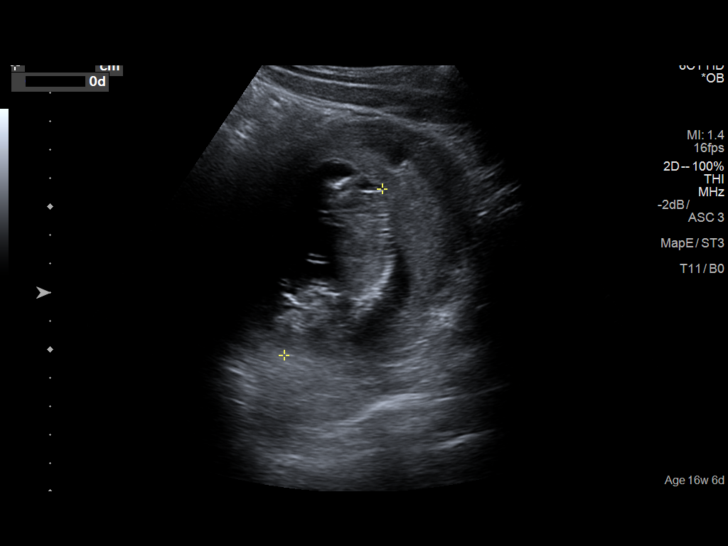
[im 4/28]
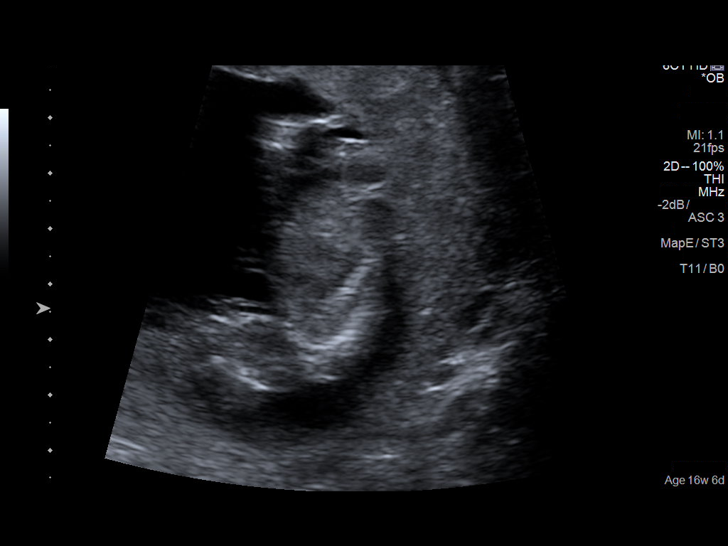
[im 6/28]
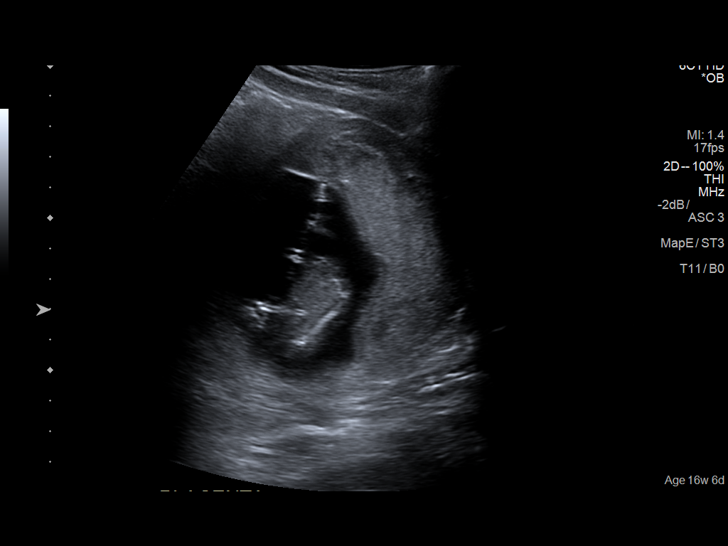
[im 8/28]
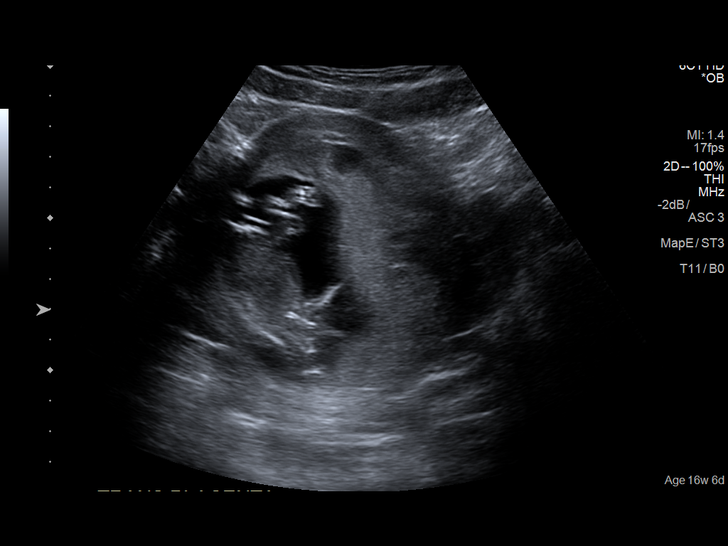
[im 10/28]
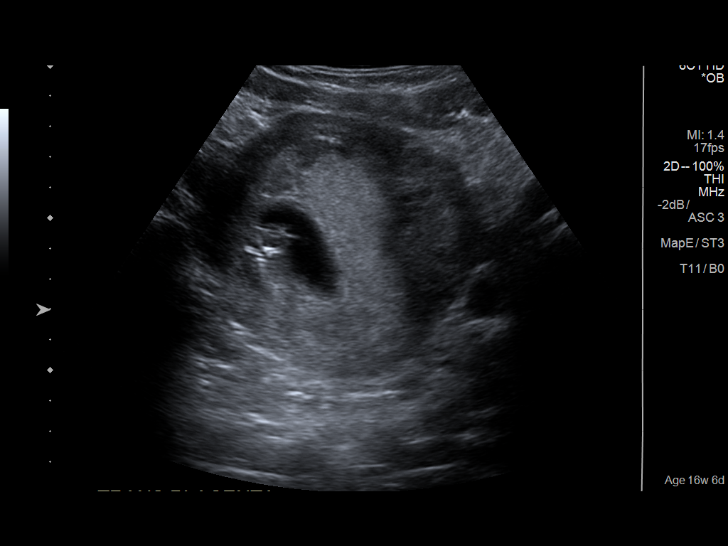
[im 12/28]
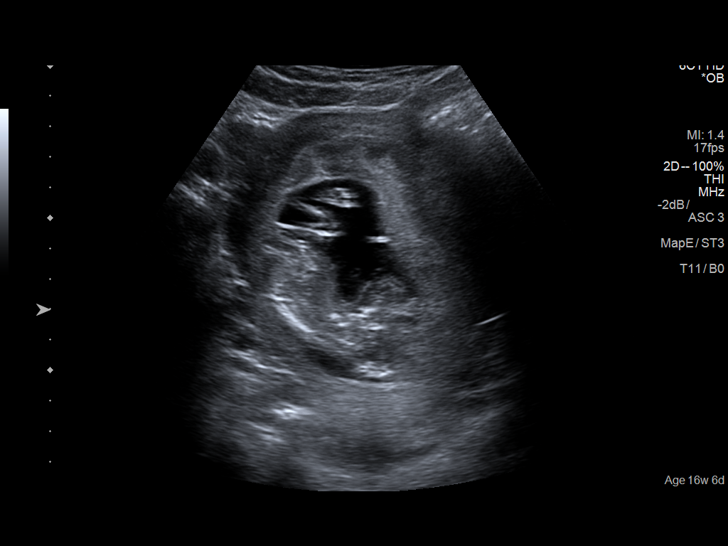
[im 14/28]
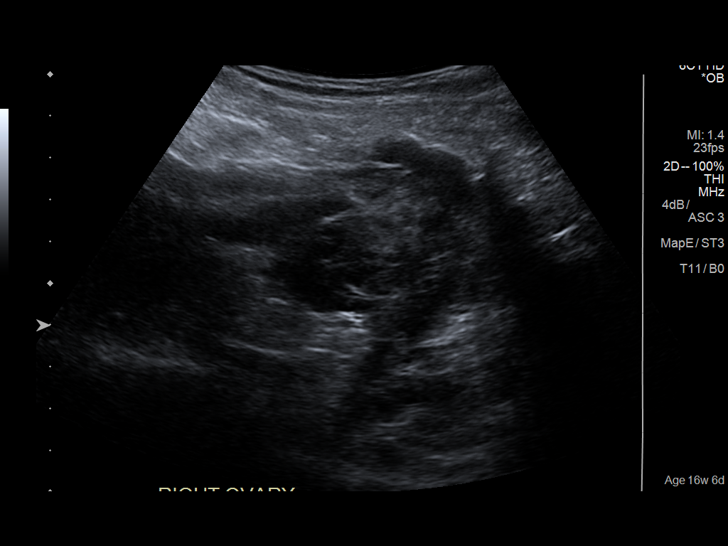
[im 16/28]
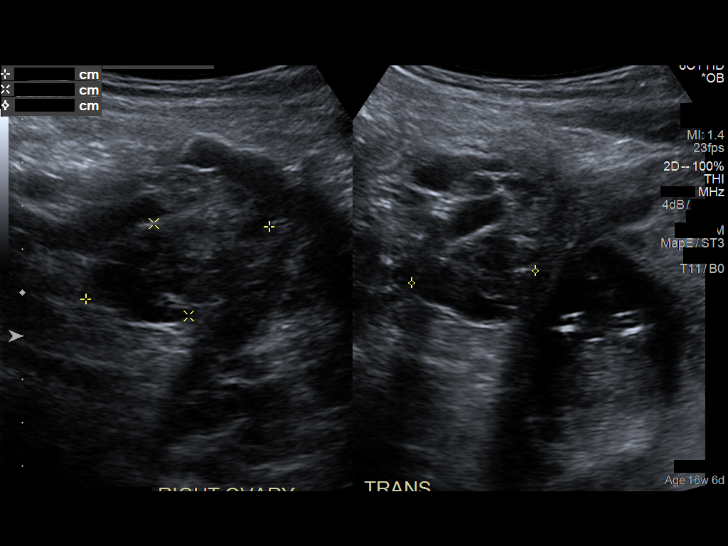
[im 18/28]
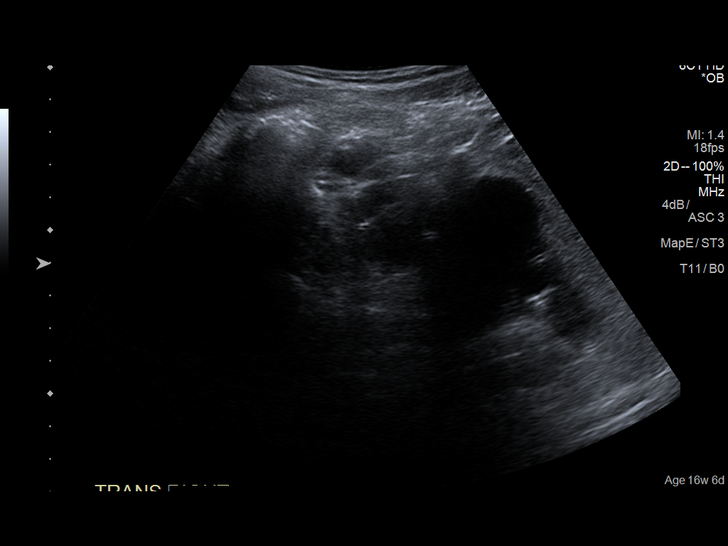
[im 20/28]
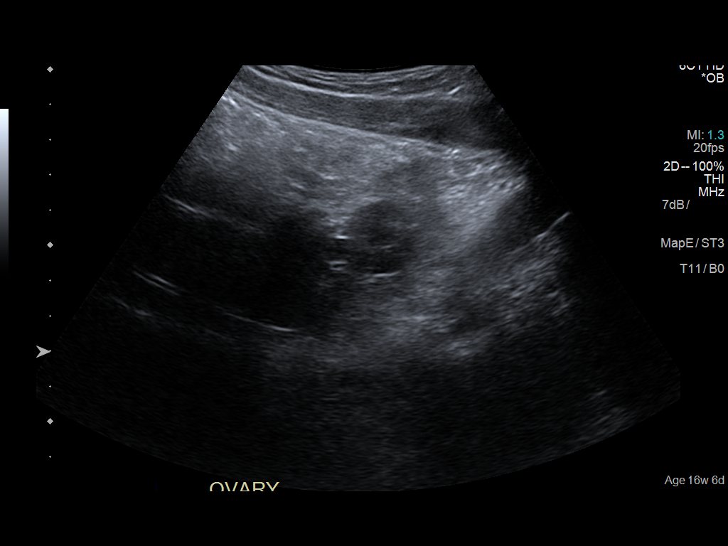
[im 22/28]
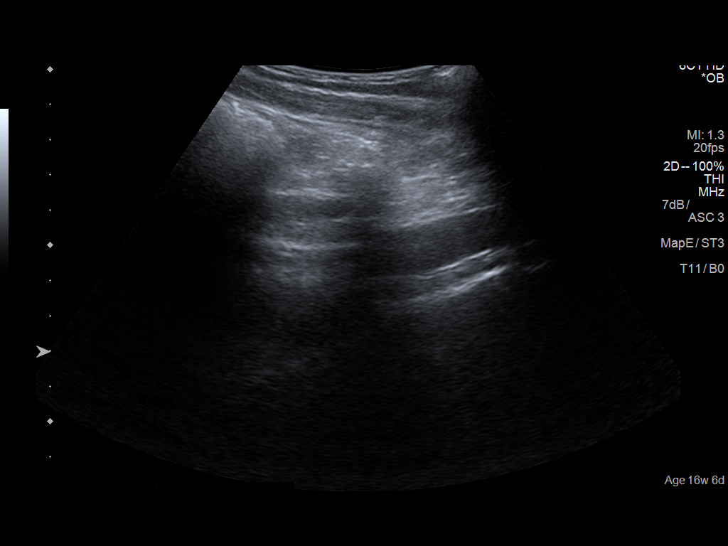
[im 24/28]
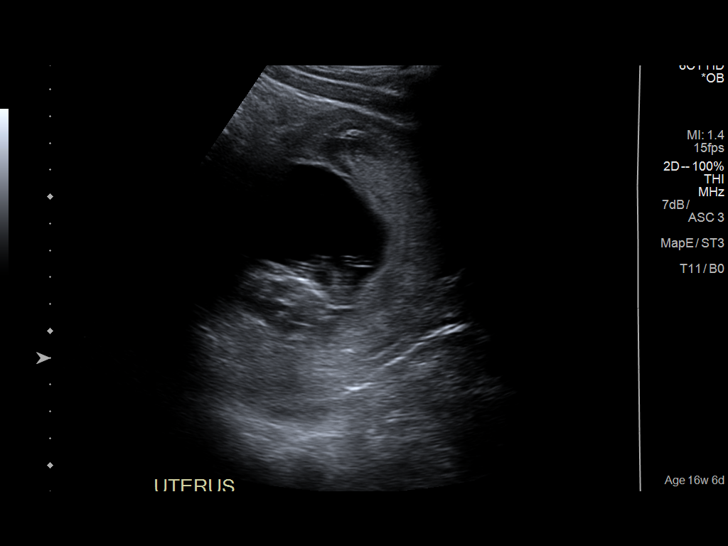
[im 26/28]
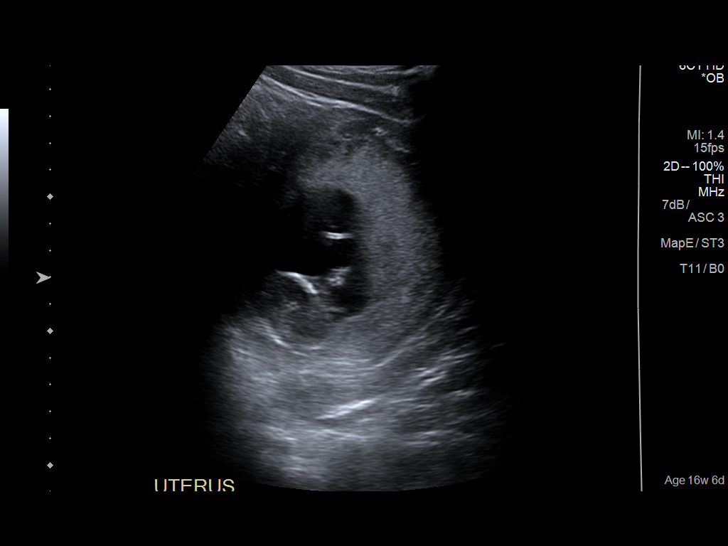
[im 28/28]
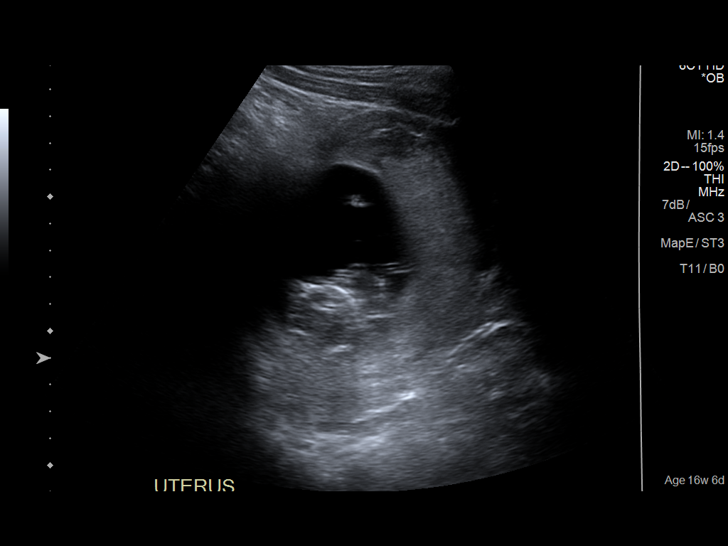

[14 of 28 positions shown; findings below may reference images not displayed]

FINDINGS: Intrauterine gestational sac: Single

Yolk sac:  Not Visualized.

Embryo:  Visualized.

Cardiac Activity: Visualized.

Heart Rate: 150 bpm

CRL:   63.1 mm   12 w 5 d                  US EDC: 03/26/2018

Subchorionic hemorrhage:  None visualized.

Maternal uterus/adnexae: The uterus and both ovaries are
unremarkable.
IMPRESSION: Single live intrauterine gestation at 12 weeks 5 days without
complicating features. No subchorionic hemorrhage is identified.

## 2020-11-22 ENCOUNTER — Encounter: Payer: Self-pay | Admitting: Emergency Medicine

## 2020-11-22 ENCOUNTER — Ambulatory Visit
Admission: EM | Admit: 2020-11-22 | Discharge: 2020-11-22 | Disposition: A | Discharge status: Home / Self Care | Payer: Medicaid Other | Attending: Internal Medicine | Admitting: Internal Medicine

## 2020-11-22 ENCOUNTER — Other Ambulatory Visit: Payer: Self-pay

## 2020-11-22 ENCOUNTER — Inpatient Hospital Stay (HOSPITAL_COMMUNITY): Admission: RE | Admit: 2020-11-22 | Payer: Medicaid Other | Source: Ambulatory Visit

## 2020-11-22 DIAGNOSIS — R102 Pelvic and perineal pain: Secondary | ICD-10-CM | POA: Insufficient documentation

## 2020-11-22 DIAGNOSIS — Z113 Encounter for screening for infections with a predominantly sexual mode of transmission: Secondary | ICD-10-CM | POA: Insufficient documentation

## 2020-11-22 DIAGNOSIS — N3000 Acute cystitis without hematuria: Secondary | ICD-10-CM | POA: Insufficient documentation

## 2020-11-22 LAB — POCT URINALYSIS DIP (MANUAL ENTRY)
Bilirubin, UA: NEGATIVE
Blood, UA: NEGATIVE
Glucose, UA: NEGATIVE mg/dL
Ketones, POC UA: NEGATIVE mg/dL
Nitrite, UA: NEGATIVE
Protein Ur, POC: NEGATIVE mg/dL
Spec Grav, UA: 1.025 (ref 1.010–1.025)
Urobilinogen, UA: 0.2 E.U./dL
pH, UA: 7 (ref 5.0–8.0)

## 2020-11-22 LAB — POCT URINE PREGNANCY: Preg Test, Ur: NEGATIVE

## 2020-11-22 MED ORDER — NITROFURANTOIN MONOHYD MACRO 100 MG PO CAPS: 100.0000 mg | ORAL_CAPSULE | Freq: Two times a day (BID) | ORAL | 0 refills | Status: AC

## 2020-11-22 NOTE — Discharge Instructions (Addendum)
Your urine did show signs of urinary tract infection.  You are being treated with Macrobid antibiotic.  Urine culture and vaginal swab are pending.  We will call if there are any abnormalities.  Please refrain from any sexual activity until test results and treatment are complete.  Please increase water intake as well.

## 2020-11-22 NOTE — ED Provider Notes (Signed)
[facility]    CSN: [id number] Arrival date & time: 11/22/20  0915      History   Chief Complaint Chief Complaint  Patient presents with   Abdominal Pain    HPI Tracy Church is a 30 y.o. female.   Patient presents with 1 to 2 month history of intermittent suprapubic abdominal pain.  Has had some urinary frequency recently as well.  Described as crampy abdominal pain.  Denies any urinary burning, vaginal discharge, fever, irregular vaginal bleeding, hematuria.  Denies any known exposure to STD.  Her last menstrual cycle was approximately 3 weeks ago.  States that she has had a UTI before and this "feels similar"   Abdominal Pain  Past Medical History:  Diagnosis Date   Medical history non-contributory    Preterm delivery     Patient Active Problem List   Diagnosis Date Noted   Pregnancy 03/15/2018   Trichomonas infection 02/11/2018   History of preterm delivery 09/24/2017   Supervision of high risk pregnancy, antepartum 09/24/2017   History of prior pregnancy with short cervix, currently pregnant 09/24/2017    Past Surgical History:  Procedure Laterality Date   NO PAST SURGERIES      OB History     Gravida  2   Para  2   Term  1   Preterm  1   AB      Living  2      SAB      IAB      Ectopic      Multiple      Live Births  2            Home Medications    Prior to Admission medications   Medication Sig Start Date End Date Taking? Authorizing Provider  nitrofurantoin, macrocrystal-monohydrate, (MACROBID) 100 MG capsule Take 1 capsule (100 mg total) by mouth 2 (two) times daily for 7 days. 11/22/20 11/29/20 Yes Lance Muss, FNP  dextromethorphan-guaiFENesin (MUCINEX DM) 30-600 MG 12hr tablet Take 1 tablet by mouth 2 (two) times daily. 03/13/20   Wieters, Hallie C, PA-C  fluticasone (FLONASE) 50 MCG/ACT nasal spray Place 1-2 sprays into both nostrils daily. 03/13/20   Wieters, Hallie C, PA-C  metroNIDAZOLE (FLAGYL) 500  MG tablet Take 1 tablet (500 mg total) by mouth 2 (two) times daily. 03/16/20   Lamptey, Britta Mccreedy, MD  ondansetron (ZOFRAN ODT) 4 MG disintegrating tablet Take 1 tablet (4 mg total) by mouth every 8 (eight) hours as needed for nausea or vomiting. 03/13/20   Wieters, Hallie C, PA-C  norethindrone (MICRONOR,CAMILA,ERRIN) 0.35 MG tablet Take 1 tablet (0.35 mg total) by mouth daily. Patient not taking: Reported on 05/17/2019 05/12/18 03/13/20  Levie Heritage, DO    Family History Family History  Adopted: Yes  Problem Relation Age of Onset   Healthy Mother    Healthy Father     Social History Social History   Tobacco Use   Smoking status: Passive Smoke Exposure - Never Smoker   Smokeless tobacco: Never  Vaping Use   Vaping Use: Former  Substance Use Topics   Alcohol use: Not Currently   Drug use: Not Currently     Allergies   Patient has no known allergies.   Review of Systems Review of Systems Per HPI  Physical Exam Triage Vital Signs ED Triage Vitals [11/22/20 0934]  Enc Vitals Group     BP 113/66     Pulse Rate 65     Resp  16     Temp 97.9 F (36.6 C)     Temp Source Oral     SpO2 98 %     Weight      Height      Head Circumference      Peak Flow      Pain Score 4     Pain Loc      Pain Edu?      Excl. in GC?    No data found.  Updated Vital Signs BP 113/66 (BP Location: Left Arm)   Pulse 65   Temp 97.9 F (36.6 C) (Oral)   Resp 16   LMP 10/30/2020   SpO2 98%   Visual Acuity Right Eye Distance:   Left Eye Distance:   Bilateral Distance:    Right Eye Near:   Left Eye Near:    Bilateral Near:     Physical Exam Constitutional:      Appearance: Normal appearance.  HENT:     Head: Normocephalic and atraumatic.  Eyes:     Extraocular Movements: Extraocular movements intact.     Conjunctiva/sclera: Conjunctivae normal.  Cardiovascular:     Rate and Rhythm: Normal rate and regular rhythm.     Pulses: Normal pulses.     Heart sounds: Normal heart  sounds.  Pulmonary:     Effort: Pulmonary effort is normal.     Breath sounds: Normal breath sounds.  Abdominal:     General: Abdomen is flat. Bowel sounds are normal. There is no distension.     Palpations: Abdomen is soft.     Tenderness: There is no abdominal tenderness. There is no right CVA tenderness or left CVA tenderness.  Musculoskeletal:     Cervical back: Normal.     Thoracic back: Normal.     Lumbar back: Normal.  Skin:    General: Skin is warm and dry.  Neurological:     General: No focal deficit present.     Mental Status: She is alert and oriented to person, place, and time. Mental status is at baseline.  Psychiatric:        Mood and Affect: Mood normal.        Behavior: Behavior normal.        Thought Content: Thought content normal.        Judgment: Judgment normal.     UC Treatments / Results  Labs (all labs ordered are listed, but only abnormal results are displayed) Labs Reviewed  POCT URINALYSIS DIP (MANUAL ENTRY) - Abnormal; Notable for the following components:      Result Value   Clarity, UA cloudy (*)    Leukocytes, UA Small (1+) (*)    All other components within normal limits  URINE CULTURE  POCT URINE PREGNANCY  CERVICOVAGINAL ANCILLARY ONLY    EKG   Radiology No results found.  Procedures Procedures (including critical care time)  Medications Ordered in UC Medications - No data to display  Initial Impression / Assessment and Plan / UC Course  I have reviewed the triage vital signs and the nursing notes.  Pertinent labs & imaging results that were available during my care of the patient were reviewed by me and considered in my medical decision making (see chart for details).     Urinalysis showing signs of urinary tract infection.  Will treat with Macrobid x7 days.  Urine culture and vaginal swab are pending.  Advised patient to refrain from sexual activity until test results and treatment are complete.Discussed  strict return  precautions. Patient verbalized understanding and is agreeable with plan.  Final Clinical Impressions(s) / UC Diagnoses   Final diagnoses:  Acute cystitis without hematuria  Screening examination for venereal disease  Suprapubic pain     Discharge Instructions      Your urine did show signs of urinary tract infection.  You are being treated with Macrobid antibiotic.  Urine culture and vaginal swab are pending.  We will call if there are any abnormalities.  Please refrain from any sexual activity until test results and treatment are complete.  Please increase water intake as well.     ED Prescriptions     Medication Sig Dispense Auth. Provider   nitrofurantoin, macrocrystal-monohydrate, (MACROBID) 100 MG capsule Take 1 capsule (100 mg total) by mouth 2 (two) times daily for 7 days. 14 capsule Lance Muss, FNP      PDMP not reviewed this encounter.   Lance Muss, FNP 11/22/20 1026

## 2020-11-22 NOTE — ED Triage Notes (Addendum)
Suprapubic cramping intermittently over the last two months with urinary frequency. Believes it may be a UTI. Denies burning or itching. States she has been having normal periods. Denies fevers, new vaginal discharge. Requesting STD check

## 2020-11-23 ENCOUNTER — Telehealth (HOSPITAL_COMMUNITY): Payer: Self-pay | Admitting: Emergency Medicine

## 2020-11-23 LAB — CERVICOVAGINAL ANCILLARY ONLY
Bacterial Vaginitis (gardnerella): POSITIVE — AB
Candida Glabrata: NEGATIVE
Candida Vaginitis: POSITIVE — AB
Chlamydia: NEGATIVE
Comment: NEGATIVE
Comment: NEGATIVE
Comment: NEGATIVE
Comment: NEGATIVE
Comment: NEGATIVE
Comment: NORMAL
Neisseria Gonorrhea: NEGATIVE
Trichomonas: NEGATIVE

## 2020-11-23 LAB — URINE CULTURE: Culture: NO GROWTH

## 2020-11-23 MED ORDER — FLUCONAZOLE 150 MG PO TABS: 150.0000 mg | ORAL_TABLET | Freq: Once | ORAL | 0 refills | Status: AC

## 2020-11-23 MED ORDER — METRONIDAZOLE 500 MG PO TABS: 500.0000 mg | ORAL_TABLET | Freq: Two times a day (BID) | ORAL | 0 refills | Status: DC

## 2020-12-06 ENCOUNTER — Other Ambulatory Visit: Payer: Self-pay

## 2020-12-06 ENCOUNTER — Emergency Department (HOSPITAL_COMMUNITY): Payer: Medicaid Other

## 2020-12-06 ENCOUNTER — Emergency Department (HOSPITAL_COMMUNITY)
Admission: EM | Admit: 2020-12-06 | Discharge: 2020-12-06 | Discharge status: Against Medical Advice | Payer: Medicaid Other | Attending: Emergency Medicine | Admitting: Emergency Medicine

## 2020-12-06 DIAGNOSIS — R079 Chest pain, unspecified: Secondary | ICD-10-CM | POA: Insufficient documentation

## 2020-12-06 DIAGNOSIS — Z5321 Procedure and treatment not carried out due to patient leaving prior to being seen by health care provider: Secondary | ICD-10-CM | POA: Diagnosis not present

## 2020-12-06 DIAGNOSIS — M549 Dorsalgia, unspecified: Secondary | ICD-10-CM | POA: Diagnosis not present

## 2020-12-06 LAB — BASIC METABOLIC PANEL
Anion gap: 6 (ref 5–15)
BUN: 8 mg/dL (ref 6–20)
CO2: 26 mmol/L (ref 22–32)
Calcium: 9.1 mg/dL (ref 8.9–10.3)
Chloride: 105 mmol/L (ref 98–111)
Creatinine, Ser: 0.66 mg/dL (ref 0.44–1.00)
GFR, Estimated: >60 mL/min (ref >60)
Glucose, Bld: 92 mg/dL (ref 70–99)
Potassium: 3.9 mmol/L (ref 3.5–5.1)
Sodium: 137 mmol/L (ref 135–145)

## 2020-12-06 LAB — TROPONIN I (HIGH SENSITIVITY)
Troponin I (High Sensitivity): 2 ng/L (ref <18)
Troponin I (High Sensitivity): <2 ng/L (ref <18)

## 2020-12-06 LAB — CBC
HCT: 37.1 % (ref 36.0–46.0)
Hemoglobin: 12.1 g/dL (ref 12.0–15.0)
MCH: 31.4 pg (ref 26.0–34.0)
MCHC: 32.6 g/dL (ref 30.0–36.0)
MCV: 96.4 fL (ref 80.0–100.0)
Platelets: 182 10*3/uL (ref 150–400)
RBC: 3.85 MIL/uL — ABNORMAL LOW (ref 3.87–5.11)
RDW: 13.2 % (ref 11.5–15.5)
WBC: 7 10*3/uL (ref 4.0–10.5)
nRBC: 0 % (ref 0.0–0.2)

## 2020-12-06 LAB — I-STAT BETA HCG BLOOD, ED (MC, WL, AP ONLY): I-stat hCG, quantitative: <5 m[IU]/mL (ref <5)

## 2020-12-06 NOTE — ED Triage Notes (Signed)
Pt c/o generalized chest pain radiating into back that started earlier tonight.

## 2020-12-06 NOTE — ED Provider Notes (Signed)
Emergency Medicine Provider Triage Evaluation Note  Tracy Church , a 30 y.o. female  was evaluated in triage.  Pt complains of chest pain that started tonight.  States that it radiates to her back.  States that it is worsened when she take a deep breath.  Denies fevers or chills.  Review of Systems  Positive: Cp, back pain Negative: Fever, chills, cough  Physical Exam  LMP 12/06/2020  Gen:   Awake, no distress   Resp:  Normal effort  MSK:   Moves extremities without difficulty  Other:    Medical Decision Making  Medically screening exam initiated at 4:54 AM.  Appropriate orders placed.  Tracy Church was informed that the remainder of the evaluation will be completed by another provider, this initial triage assessment does not replace that evaluation, and the importance of remaining in the ED until their evaluation is complete.  Chest pain   Roxy Horseman, PA-C 12/06/20 1103    Palumbo, April, MD 12/06/20 1594

## 2020-12-06 NOTE — ED Notes (Signed)
Pt called for vitals. No response.  

## 2020-12-28 ENCOUNTER — Ambulatory Visit (INDEPENDENT_AMBULATORY_CARE_PROVIDER_SITE_OTHER): Payer: Medicaid Other

## 2020-12-28 ENCOUNTER — Other Ambulatory Visit: Payer: Self-pay

## 2020-12-28 ENCOUNTER — Ambulatory Visit (HOSPITAL_COMMUNITY)
Admission: EM | Admit: 2020-12-28 | Discharge: 2020-12-28 | Disposition: A | Discharge status: Home / Self Care | Payer: Medicaid Other | Attending: Family Medicine | Admitting: Family Medicine

## 2020-12-28 ENCOUNTER — Encounter (HOSPITAL_COMMUNITY): Payer: Self-pay

## 2020-12-28 DIAGNOSIS — M25531 Pain in right wrist: Secondary | ICD-10-CM | POA: Diagnosis not present

## 2020-12-28 DIAGNOSIS — M542 Cervicalgia: Secondary | ICD-10-CM

## 2020-12-28 DIAGNOSIS — S161XXA Strain of muscle, fascia and tendon at neck level, initial encounter: Secondary | ICD-10-CM

## 2020-12-28 MED ORDER — NAPROXEN 500 MG PO TABS: 500.0000 mg | ORAL_TABLET | Freq: Two times a day (BID) | ORAL | 0 refills | Status: DC

## 2020-12-28 MED ORDER — CYCLOBENZAPRINE HCL 10 MG PO TABS: 10.0000 mg | ORAL_TABLET | Freq: Three times a day (TID) | ORAL | 0 refills | Status: DC | PRN
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
## 2021-01-16 ENCOUNTER — Emergency Department (HOSPITAL_COMMUNITY)
Admission: EM | Admit: 2021-01-16 | Discharge: 2021-01-17 | Disposition: A | Discharge status: Home / Self Care | Payer: Medicaid Other | Source: Home / Self Care | Attending: Emergency Medicine | Admitting: Emergency Medicine

## 2021-01-16 ENCOUNTER — Emergency Department (HOSPITAL_COMMUNITY): Payer: Medicaid Other

## 2021-01-16 ENCOUNTER — Emergency Department (HOSPITAL_COMMUNITY)
Admission: EM | Admit: 2021-01-16 | Discharge: 2021-01-16 | Disposition: A | Discharge status: Home / Self Care | Payer: Medicaid Other | Attending: Emergency Medicine | Admitting: Emergency Medicine

## 2021-01-16 ENCOUNTER — Encounter (HOSPITAL_COMMUNITY): Payer: Self-pay

## 2021-01-16 ENCOUNTER — Other Ambulatory Visit: Payer: Self-pay

## 2021-01-16 ENCOUNTER — Encounter (HOSPITAL_COMMUNITY): Payer: Self-pay | Admitting: Emergency Medicine

## 2021-01-16 DIAGNOSIS — Z7722 Contact with and (suspected) exposure to environmental tobacco smoke (acute) (chronic): Secondary | ICD-10-CM | POA: Diagnosis not present

## 2021-01-16 DIAGNOSIS — R109 Unspecified abdominal pain: Secondary | ICD-10-CM | POA: Insufficient documentation

## 2021-01-16 DIAGNOSIS — R350 Frequency of micturition: Secondary | ICD-10-CM | POA: Diagnosis not present

## 2021-01-16 DIAGNOSIS — R1033 Periumbilical pain: Secondary | ICD-10-CM | POA: Diagnosis not present

## 2021-01-16 DIAGNOSIS — R112 Nausea with vomiting, unspecified: Secondary | ICD-10-CM | POA: Diagnosis not present

## 2021-01-16 DIAGNOSIS — M545 Low back pain, unspecified: Secondary | ICD-10-CM | POA: Diagnosis not present

## 2021-01-16 DIAGNOSIS — Z5321 Procedure and treatment not carried out due to patient leaving prior to being seen by health care provider: Secondary | ICD-10-CM | POA: Insufficient documentation

## 2021-01-16 LAB — CBC
HCT: 38.8 % (ref 36.0–46.0)
Hemoglobin: 12.5 g/dL (ref 12.0–15.0)
MCH: 31.3 pg (ref 26.0–34.0)
MCHC: 32.2 g/dL (ref 30.0–36.0)
MCV: 97.2 fL (ref 80.0–100.0)
Platelets: 206 10*3/uL (ref 150–400)
RBC: 3.99 MIL/uL (ref 3.87–5.11)
RDW: 13 % (ref 11.5–15.5)
WBC: 6.8 10*3/uL (ref 4.0–10.5)
nRBC: 0 % (ref 0.0–0.2)

## 2021-01-16 LAB — COMPREHENSIVE METABOLIC PANEL
ALT: 152 U/L — ABNORMAL HIGH (ref 0–44)
AST: 252 U/L — ABNORMAL HIGH (ref 15–41)
Albumin: 4 g/dL (ref 3.5–5.0)
Alkaline Phosphatase: 86 U/L (ref 38–126)
Anion gap: 9 (ref 5–15)
BUN: 11 mg/dL (ref 6–20)
CO2: 26 mmol/L (ref 22–32)
Calcium: 9 mg/dL (ref 8.9–10.3)
Chloride: 103 mmol/L (ref 98–111)
Creatinine, Ser: 0.67 mg/dL (ref 0.44–1.00)
GFR, Estimated: >60 mL/min (ref >60)
Glucose, Bld: 125 mg/dL — ABNORMAL HIGH (ref 70–99)
Potassium: 3.5 mmol/L (ref 3.5–5.1)
Sodium: 138 mmol/L (ref 135–145)
Total Bilirubin: 0.6 mg/dL (ref 0.3–1.2)
Total Protein: 7.2 g/dL (ref 6.5–8.1)

## 2021-01-16 LAB — I-STAT BETA HCG BLOOD, ED (MC, WL, AP ONLY): I-stat hCG, quantitative: <5 m[IU]/mL (ref <5)

## 2021-01-16 LAB — LIPASE, BLOOD: Lipase: 43 U/L (ref 11–51)

## 2021-01-16 MED ORDER — ACETAMINOPHEN 325 MG PO TABS
650.0000 mg | ORAL_TABLET | Freq: Once | ORAL | Status: AC
  Administered 2021-01-16: 650 mg via ORAL
  Filled 2021-01-16: qty 2

## 2021-01-16 NOTE — ED Provider Notes (Signed)
Emergency Medicine Provider Triage Evaluation Note  Tracy Church , a 30 y.o. female  was evaluated in triage.  Pt complains of abdominal pain x 1 week. Patient was seen last week for the same and states symptoms initially improved but returned and were more severe.  She describes the pain as tightness beginning in her left flank, and radiating to her abdomen and sometimes up into her chest.  She notes increased urination, no dysuria.  Intermittently feeling feverish at home, no documented temperature.  Review of Systems  Positive: Flank pain, abdominal pain, nausea, increased urinary frequency Negative: Vomiting, diarrhea, dysuria, vaginal discharge or bleeding, pelvic pain  Physical Exam  BP (!) 116/50   Pulse 66   Temp 98.5 F (36.9 C)   Resp 18   SpO2 99%  Gen:   Awake, no distress   Resp:  Normal effort  MSK:   Moves extremities without difficulty  Other:  Patient moving around uncomfortably  Medical Decision Making  Medically screening exam initiated at 8:09 PM.  Appropriate orders placed.  Elleni Wimbish-Baker was informed that the remainder of the evaluation will be completed by another provider, this initial triage assessment does not replace that evaluation, and the importance of remaining in the ED until their evaluation is complete.     Jeanella Flattery 01/16/21 2015    Terrilee Files, MD 01/16/21 [id number]
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
## 2021-01-16 NOTE — ED Triage Notes (Signed)
Patient here with abdominal pain on the right side of her abdomen.  Was here for same last week.  Patient states that it woke her from sleep.  No nausea or vomiting.

## 2021-01-16 NOTE — ED Notes (Signed)
Pt stated she did not want to wait here and is going to [place]
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
## 2021-01-17 ENCOUNTER — Encounter (HOSPITAL_COMMUNITY): Payer: Self-pay

## 2021-01-17 ENCOUNTER — Emergency Department (HOSPITAL_COMMUNITY): Payer: Medicaid Other

## 2021-01-17 LAB — URINALYSIS, MICROSCOPIC (REFLEX)
Bacteria, UA: NONE SEEN
RBC / HPF: NONE SEEN RBC/hpf (ref 0–5)
WBC, UA: NONE SEEN WBC/hpf (ref 0–5)

## 2021-01-17 LAB — URINALYSIS, ROUTINE W REFLEX MICROSCOPIC
Bilirubin Urine: NEGATIVE
Glucose, UA: NEGATIVE mg/dL
Hgb urine dipstick: NEGATIVE
Ketones, ur: NEGATIVE mg/dL
Leukocytes,Ua: NEGATIVE
Nitrite: NEGATIVE
Specific Gravity, Urine: <1.005 — ABNORMAL LOW (ref 1.005–1.030)
pH: 8.5 — ABNORMAL HIGH (ref 5.0–8.0)

## 2021-01-17 MED ORDER — DICYCLOMINE HCL 20 MG PO TABS: 20.0000 mg | ORAL_TABLET | Freq: Two times a day (BID) | ORAL | 0 refills | Status: DC | PRN

## 2021-01-17 MED ORDER — KETOROLAC TROMETHAMINE 15 MG/ML IJ SOLN
15.0000 mg | Freq: Once | INTRAMUSCULAR | Status: AC
  Administered 2021-01-17: 15 mg via INTRAVENOUS
  Filled 2021-01-17: qty 1

## 2021-01-17 MED ORDER — IOHEXOL 350 MG/ML SOLN
80.0000 mL | Freq: Once | INTRAVENOUS | Status: AC | PRN
  Administered 2021-01-17: 80 mL via INTRAVENOUS

## 2021-01-17 NOTE — Discharge Instructions (Addendum)
Your work-up in the emergency department today was reassuring.  We recommend follow-up with a primary care doctor.  Your CT did suggest possibility of pelvic congestion which can be followed as an outpatient by an OB/GYN.  You have been prescribed Bentyl to use for management of any persistent abdominal discomfort.  This can be taken with Tylenol or ibuprofen.  Return to the ED for new or concerning symptoms.
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
## 2021-12-07 ENCOUNTER — Ambulatory Visit
Admission: EM | Admit: 2021-12-07 | Discharge: 2021-12-07 | Disposition: A | Discharge status: Home / Self Care | Payer: Medicaid Other | Attending: Physician Assistant | Admitting: Physician Assistant

## 2021-12-07 DIAGNOSIS — Z113 Encounter for screening for infections with a predominantly sexual mode of transmission: Secondary | ICD-10-CM | POA: Diagnosis present

## 2021-12-07 DIAGNOSIS — M549 Dorsalgia, unspecified: Secondary | ICD-10-CM | POA: Diagnosis not present

## 2021-12-07 DIAGNOSIS — Z3202 Encounter for pregnancy test, result negative: Secondary | ICD-10-CM | POA: Diagnosis not present

## 2021-12-07 LAB — POCT URINE PREGNANCY: Preg Test, Ur: NEGATIVE

## 2021-12-07 MED ORDER — CYCLOBENZAPRINE HCL 10 MG PO TABS: 10.0000 mg | ORAL_TABLET | Freq: Two times a day (BID) | ORAL | 0 refills | Status: DC | PRN

## 2021-12-07 MED ORDER — PREDNISONE 20 MG PO TABS: 40.0000 mg | ORAL_TABLET | Freq: Every day | ORAL | 0 refills | Status: AC
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
## 2021-12-08 ENCOUNTER — Telehealth (HOSPITAL_COMMUNITY): Payer: Self-pay | Admitting: Emergency Medicine

## 2021-12-08 LAB — RPR: RPR Ser Ql: NONREACTIVE

## 2021-12-08 LAB — CERVICOVAGINAL ANCILLARY ONLY
Bacterial Vaginitis (gardnerella): POSITIVE — AB
Candida Glabrata: NEGATIVE
Candida Vaginitis: NEGATIVE
Chlamydia: POSITIVE — AB
Comment: NEGATIVE
Comment: NEGATIVE
Comment: NEGATIVE
Comment: NEGATIVE
Comment: NEGATIVE
Comment: NORMAL
Neisseria Gonorrhea: POSITIVE — AB
Trichomonas: NEGATIVE

## 2021-12-08 LAB — HIV ANTIBODY (ROUTINE TESTING W REFLEX): HIV Screen 4th Generation wRfx: NONREACTIVE

## 2021-12-08 MED ORDER — DOXYCYCLINE HYCLATE 100 MG PO CAPS: 100.0000 mg | ORAL_CAPSULE | Freq: Two times a day (BID) | ORAL | 0 refills | Status: AC

## 2021-12-08 MED ORDER — METRONIDAZOLE 0.75 % VA GEL: 1.0000 | Freq: Every day | VAGINAL | 0 refills | Status: AC

## 2021-12-08 NOTE — Telephone Encounter (Signed)
Per protocol, patient will need treatment with IM Rocephin 500mg for positive Gonorrhea. Will also need treatment with Doxycycline and Metrogel. Attempted to reach patient x 1, LVM Prescription sent to pharmacy on file [facility] notified 

## 2021-12-11 ENCOUNTER — Ambulatory Visit
Admission: EM | Admit: 2021-12-11 | Discharge: 2021-12-11 | Disposition: A | Discharge status: Home / Self Care | Payer: Medicaid Other | Attending: Internal Medicine | Admitting: Internal Medicine

## 2021-12-11 DIAGNOSIS — A549 Gonococcal infection, unspecified: Secondary | ICD-10-CM

## 2021-12-11 MED ORDER — CEFTRIAXONE SODIUM 500 MG IJ SOLR
500.0000 mg | Freq: Once | INTRAMUSCULAR | Status: AC
  Administered 2021-12-11: 500 mg via INTRAMUSCULAR

## 2021-12-11 NOTE — ED Triage Notes (Signed)
Pt here for rn visit for sti tx. See orders. Tx administered without complication. Education provided.

## 2022-10-10 ENCOUNTER — Ambulatory Visit
Admission: EM | Admit: 2022-10-10 | Discharge: 2022-10-10 | Disposition: A | Discharge status: Home / Self Care | Payer: Medicaid Other | Attending: Physician Assistant | Admitting: Physician Assistant

## 2022-10-10 DIAGNOSIS — M25421 Effusion, right elbow: Secondary | ICD-10-CM | POA: Diagnosis present

## 2022-10-10 MED ORDER — CEFTRIAXONE SODIUM 1 G IJ SOLR
1.0000 g | Freq: Once | INTRAMUSCULAR | Status: AC
  Administered 2022-10-10: 1 g via INTRAMUSCULAR

## 2022-10-10 MED ORDER — PREDNISONE 20 MG PO TABS: 40.0000 mg | ORAL_TABLET | Freq: Every day | ORAL | 0 refills | Status: AC

## 2022-10-10 NOTE — ED Provider Notes (Signed)
[name]    CSN: [id number] Arrival date & time: 10/10/22  1717      History   Chief Complaint Chief Complaint  Patient presents with   Elbow Problem    Right (Knot)    HPI Tracy Church is a 32 y.o. female.   Patient here today for evaluation of swelling to her right elbow that she first noticed this morning.  She states elbow is warm to touch and is very painful.  She denies any known injury.  She has not had any fever.  She reports some nausea but no vomiting.  She does not report history of similar symptoms.  She is not necessarily concerned for STDs but would like screening.  The history is provided by the patient.    Past Medical History:  Diagnosis Date   Medical history non-contributory    Preterm delivery     Patient Active Problem List   Diagnosis Date Noted   Pregnancy 03/15/2018   Trichomonas infection 02/11/2018   History of preterm delivery 09/24/2017   Supervision of high risk pregnancy, antepartum 09/24/2017   History of prior pregnancy with short cervix, currently pregnant 09/24/2017    Past Surgical History:  Procedure Laterality Date   NO PAST SURGERIES      OB History     Gravida  2   Para  2   Term  1   Preterm  1   AB      Living  2      SAB      IAB      Ectopic      Multiple      Live Births  2            Home Medications    Prior to Admission medications   Medication Sig Start Date End Date Taking? Authorizing Provider  predniSONE (DELTASONE) 20 MG tablet Take 2 tablets (40 mg total) by mouth daily with breakfast for 5 days. 10/10/22 10/15/22 Yes Tomi Bamberger, PA-C  cyclobenzaprine (FLEXERIL) 10 MG tablet Take 1 tablet (10 mg total) by mouth 2 (two) times daily as needed for muscle spasms. 12/07/21   Tomi Bamberger, PA-C  dextromethorphan-guaiFENesin [facility] DM) 30-600 MG 12hr tablet Take 1 tablet by mouth 2 (two) times daily. 03/13/20   Wieters, Hallie C, PA-C  dicyclomine (BENTYL) 20  MG tablet Take 1 tablet (20 mg total) by mouth every 12 (twelve) hours as needed (for abdominal pain/cramping). 01/17/21   Antony Madura, PA-C  fluticasone (FLONASE) 50 MCG/ACT nasal spray Place 1-2 sprays into both nostrils daily. 03/13/20   Wieters, Hallie C, PA-C  metroNIDAZOLE (FLAGYL) 500 MG tablet Take 1 tablet (500 mg total) by mouth 2 (two) times daily. 11/23/20   Lamptey, Britta Mccreedy, MD  naproxen (NAPROSYN) 500 MG tablet Take 1 tablet (500 mg total) by mouth 2 (two) times daily. 12/28/20   Particia Nearing, PA-C  ondansetron (ZOFRAN ODT) 4 MG disintegrating tablet Take 1 tablet (4 mg total) by mouth every 8 (eight) hours as needed for nausea or vomiting. 03/13/20   Wieters, Hallie C, PA-C  norethindrone (MICRONOR,CAMILA,ERRIN) 0.35 MG tablet Take 1 tablet (0.35 mg total) by mouth daily. Patient not taking: Reported on 05/17/2019 05/12/18 03/13/20  Levie Heritage, DO    Family History Family History  Adopted: Yes  Problem Relation Age of Onset   Healthy Mother    Healthy Father     Social History Social History   Tobacco Use  Smoking status: Every Day    Types: Cigars    Passive exposure: Yes   Smokeless tobacco: Never  Vaping Use   Vaping status: Former  Substance Use Topics   Alcohol use: Not Currently   Drug use: Not Currently     Allergies   Patient has no known allergies.   Review of Systems Review of Systems  Constitutional:  Negative for chills and fever.  Eyes:  Negative for discharge and redness.  Respiratory:  Negative for shortness of breath.   Gastrointestinal:  Positive for nausea. Negative for vomiting.  Genitourinary:  Negative for vaginal discharge.  Musculoskeletal:  Positive for arthralgias and joint swelling.  Skin:  Positive for color change.  Neurological:  Negative for numbness.     Physical Exam Triage Vital Signs ED Triage Vitals  Encounter Vitals Group     BP 10/10/22 1805 109/65     Systolic BP Percentile --      Diastolic BP  Percentile --      Pulse Rate 10/10/22 1805 79     Resp 10/10/22 1805 18     Temp 10/10/22 1805 98.5 F (36.9 C)     Temp Source 10/10/22 1805 Oral     SpO2 10/10/22 1805 99 %     Weight 10/10/22 1803 150 lb (68 kg)     Height 10/10/22 1803 5\' 6"  (1.676 m)     Head Circumference --      Peak Flow --      Pain Score 10/10/22 1802 6     Pain Loc --      Pain Education --      Exclude from Growth Chart --    No data found.  Updated Vital Signs BP 109/65 (BP Location: Left Arm)   Pulse 79   Temp 98.5 F (36.9 C) (Oral)   Resp 18   Ht 5\' 6"  (1.676 m)   Wt 150 lb (68 kg)   LMP 09/22/2022 (Approximate)   SpO2 99%   BMI 24.21 kg/m     Physical Exam Vitals and nursing note reviewed.  Constitutional:      General: She is not in acute distress.    Appearance: Normal appearance. She is not ill-appearing.  HENT:     Head: Normocephalic and atraumatic.  Eyes:     Conjunctiva/sclera: Conjunctivae normal.  Cardiovascular:     Rate and Rhythm: Normal rate.  Pulmonary:     Effort: Pulmonary effort is normal. No respiratory distress.  Musculoskeletal:     Comments: Diffuse swelling to right olecranon process with warmth and tenderness palpation noted.  Mildly decreased range of motion of right elbow due to swelling.  Neurological:     Mental Status: She is alert.     Comments: Gross sensation intact to right fingers distally  Psychiatric:        Mood and Affect: Mood normal.        Behavior: Behavior normal.        Thought Content: Thought content normal.      UC Treatments / Results  Labs (all labs ordered are listed, but only abnormal results are displayed) Labs Reviewed  CYTOLOGY, (ORAL, ANAL, URETHRAL) ANCILLARY ONLY    EKG   Radiology No results found.  Procedures Procedures (including critical care time)  Medications Ordered in UC Medications  cefTRIAXone (ROCEPHIN) injection 1 g (has no administration in time range)    Initial Impression / Assessment  and Plan / UC Course  I have reviewed  the triage vital signs and the nursing notes.  Pertinent labs & imaging results that were available during my care of the patient were reviewed by me and considered in my medical decision making (see chart for details).    Discussed differential including olecranon bursitis versus septic joint.  Will screen for gonorrhea and chlamydia and will treat with Rocephin prophylactically.  Steroid burst also prescribed to help with swelling in the event bursitis is not infectious.  Encouraged follow-up if no gradual improvement or with any further concerns and advised ED evaluation if symptoms worsen.   Final Clinical Impressions(s) / UC Diagnoses   Final diagnoses:  Swelling of right elbow   Discharge Instructions   None    ED Prescriptions     Medication Sig Dispense Auth. Provider   predniSONE (DELTASONE) 20 MG tablet Take 2 tablets (40 mg total) by mouth daily with breakfast for 5 days. 10 tablet Tomi Bamberger, PA-C      PDMP not reviewed this encounter.   Tomi Bamberger, PA-C 10/10/22 1919

## 2022-10-10 NOTE — ED Triage Notes (Signed)
"  Knot/swelling on right elbow". No injury known. "Swelling, redness".

## 2022-10-11 ENCOUNTER — Telehealth: Payer: Self-pay | Admitting: Emergency Medicine
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
## 2023-04-08 ENCOUNTER — Emergency Department (HOSPITAL_BASED_OUTPATIENT_CLINIC_OR_DEPARTMENT_OTHER): Payer: Medicaid Other

## 2023-04-08 ENCOUNTER — Other Ambulatory Visit: Payer: Self-pay

## 2023-04-08 ENCOUNTER — Emergency Department (HOSPITAL_BASED_OUTPATIENT_CLINIC_OR_DEPARTMENT_OTHER)
Admission: EM | Admit: 2023-04-08 | Discharge: 2023-04-08 | Disposition: A | Discharge status: Home / Self Care | Payer: Medicaid Other

## 2023-04-08 ENCOUNTER — Encounter (HOSPITAL_BASED_OUTPATIENT_CLINIC_OR_DEPARTMENT_OTHER): Payer: Self-pay | Admitting: Emergency Medicine

## 2023-04-08 DIAGNOSIS — Z79899 Other long term (current) drug therapy: Secondary | ICD-10-CM | POA: Diagnosis not present

## 2023-04-08 DIAGNOSIS — K529 Noninfective gastroenteritis and colitis, unspecified: Secondary | ICD-10-CM | POA: Diagnosis not present

## 2023-04-08 DIAGNOSIS — R109 Unspecified abdominal pain: Secondary | ICD-10-CM | POA: Diagnosis present

## 2023-04-08 LAB — COMPREHENSIVE METABOLIC PANEL
ALT: 13 U/L (ref 0–44)
AST: 14 U/L — ABNORMAL LOW (ref 15–41)
Albumin: 4.1 g/dL (ref 3.5–5.0)
Alkaline Phosphatase: 48 U/L (ref 38–126)
Anion gap: 6 (ref 5–15)
BUN: 8 mg/dL (ref 6–20)
CO2: 28 mmol/L (ref 22–32)
Calcium: 9.2 mg/dL (ref 8.9–10.3)
Chloride: 105 mmol/L (ref 98–111)
Creatinine, Ser: 0.66 mg/dL (ref 0.44–1.00)
GFR, Estimated: >60 mL/min (ref >60)
Glucose, Bld: 104 mg/dL — ABNORMAL HIGH (ref 70–99)
Potassium: 3.6 mmol/L (ref 3.5–5.1)
Sodium: 139 mmol/L (ref 135–145)
Total Bilirubin: 0.5 mg/dL (ref 0.0–1.2)
Total Protein: 7.3 g/dL (ref 6.5–8.1)

## 2023-04-08 LAB — URINALYSIS, ROUTINE W REFLEX MICROSCOPIC
Bilirubin Urine: NEGATIVE
Glucose, UA: NEGATIVE mg/dL
Ketones, ur: NEGATIVE mg/dL
Leukocytes,Ua: NEGATIVE
Nitrite: NEGATIVE
RBC / HPF: >50 RBC/hpf (ref 0–5)
Specific Gravity, Urine: 1.018 (ref 1.005–1.030)
pH: 6 (ref 5.0–8.0)

## 2023-04-08 LAB — CBC
HCT: 35.7 % — ABNORMAL LOW (ref 36.0–46.0)
Hemoglobin: 11.9 g/dL — ABNORMAL LOW (ref 12.0–15.0)
MCH: 32.7 pg (ref 26.0–34.0)
MCHC: 33.3 g/dL (ref 30.0–36.0)
MCV: 98.1 fL (ref 80.0–100.0)
Platelets: 162 10*3/uL (ref 150–400)
RBC: 3.64 MIL/uL — ABNORMAL LOW (ref 3.87–5.11)
RDW: 13 % (ref 11.5–15.5)
WBC: 9 10*3/uL (ref 4.0–10.5)
nRBC: 0 % (ref 0.0–0.2)

## 2023-04-08 LAB — LIPASE, BLOOD: Lipase: 20 U/L (ref 11–51)

## 2023-04-08 LAB — PREGNANCY, URINE: Preg Test, Ur: NEGATIVE

## 2023-04-08 MED ORDER — ONDANSETRON 4 MG PO TBDP
4.0000 mg | ORAL_TABLET | Freq: Once | ORAL | Status: AC
Start: 2023-04-08 — End: 2023-04-08
  Administered 2023-04-08: 4 mg via ORAL
  Filled 2023-04-08: qty 1

## 2023-04-08 MED ORDER — ONDANSETRON HCL 4 MG PO TABS: 4.0000 mg | ORAL_TABLET | Freq: Four times a day (QID) | ORAL | 0 refills | Status: DC

## 2023-04-08 MED ORDER — KETOROLAC TROMETHAMINE 15 MG/ML IJ SOLN: 15.0000 mg | Freq: Once | INTRAMUSCULAR | Status: DC

## 2023-04-08 MED ORDER — DICYCLOMINE HCL 20 MG PO TABS
20.0000 mg | ORAL_TABLET | Freq: Two times a day (BID) | ORAL | 0 refills | Status: DC | PRN
Start: 2023-04-08 — End: 2023-07-05

## 2023-04-08 MED ORDER — KETOROLAC TROMETHAMINE 30 MG/ML IJ SOLN
INTRAMUSCULAR | Status: AC
  Filled 2023-04-08: qty 1

## 2023-04-08 MED ORDER — DICYCLOMINE HCL 10 MG PO CAPS
10.0000 mg | ORAL_CAPSULE | Freq: Once | ORAL | Status: AC
  Administered 2023-04-08: 10 mg via ORAL
  Filled 2023-04-08: qty 1

## 2023-04-08 MED ORDER — DICYCLOMINE HCL 20 MG PO TABS: 20.0000 mg | ORAL_TABLET | Freq: Two times a day (BID) | ORAL | 0 refills | Status: DC | PRN

## 2023-04-08 MED ORDER — KETOROLAC TROMETHAMINE 30 MG/ML IJ SOLN
15.0000 mg | Freq: Once | INTRAMUSCULAR | Status: AC
  Administered 2023-04-08: 15 mg via INTRAMUSCULAR

## 2023-04-08 NOTE — Discharge Instructions (Signed)
Please follow-up with your primary doctor.  Take the medications we prescribed you for symptomatic relief.  Return immediately felt fevers, chills, worsening pain, inability eat or drink due to nausea and vomiting, lightheadedness, passout, chest pain or any new or worsening symptoms that are concerning to you.

## 2023-04-08 NOTE — ED Provider Notes (Signed)
[place] EMERGENCY DEPARTMENT AT [facility] Provider Note   CSN: [id number] Arrival date & time: 04/08/23  8295     History  Chief Complaint  Patient presents with   Abdominal Pain    Tracy Church is a 33 y.o. female.  33 year old female presenting emergency department with lower abdominal pain radiating to her left back.  Having some dysuria and urgency.  Subjective fevers.  Reports nausea, but no vomiting.  Had some loose stools as well.  No prior abdominal surgeries.   Abdominal Pain      Home Medications Prior to Admission medications   Medication Sig Start Date End Date Taking? Authorizing Provider  dicyclomine (BENTYL) 20 MG tablet Take 1 tablet (20 mg total) by mouth 2 (two) times daily as needed for up to 3 days for spasms. 04/08/23 04/11/23 Yes Eilleen Davoli, Harmon Dun, DO  ondansetron (ZOFRAN) 4 MG tablet Take 1 tablet (4 mg total) by mouth every 6 (six) hours. 04/08/23  Yes Coral Spikes, DO  cyclobenzaprine (FLEXERIL) 10 MG tablet Take 1 tablet (10 mg total) by mouth 2 (two) times daily as needed for muscle spasms. 12/07/21   Tomi Bamberger, PA-C  dextromethorphan-guaiFENesin [facility] DM) 30-600 MG 12hr tablet Take 1 tablet by mouth 2 (two) times daily. 03/13/20   Wieters, Hallie C, PA-C  fluticasone (FLONASE) 50 MCG/ACT nasal spray Place 1-2 sprays into both nostrils daily. 03/13/20   Wieters, Hallie C, PA-C  metroNIDAZOLE (FLAGYL) 500 MG tablet Take 1 tablet (500 mg total) by mouth 2 (two) times daily. 11/23/20   Lamptey, Britta Mccreedy, MD  naproxen (NAPROSYN) 500 MG tablet Take 1 tablet (500 mg total) by mouth 2 (two) times daily. 12/28/20   Particia Nearing, PA-C  ondansetron (ZOFRAN ODT) 4 MG disintegrating tablet Take 1 tablet (4 mg total) by mouth every 8 (eight) hours as needed for nausea or vomiting. 03/13/20   Wieters, Hallie C, PA-C  norethindrone (MICRONOR,CAMILA,ERRIN) 0.35 MG tablet Take 1 tablet (0.35 mg total) by mouth daily. Patient not taking:  Reported on 05/17/2019 05/12/18 03/13/20  Levie Heritage, DO      Allergies    Patient has no known allergies.    Review of Systems   Review of Systems  Gastrointestinal:  Positive for abdominal pain.    Physical Exam Updated Vital Signs BP 121/75   Pulse 60   Temp 98.4 F (36.9 C) (Temporal)   Resp 17   Ht 5\' 6"  (1.676 m)   Wt 65.8 kg   LMP 03/29/2023 (Approximate)   SpO2 100%   BMI 23.40 kg/m  Physical Exam Vitals and nursing note reviewed.  Constitutional:      General: She is not in acute distress.    Appearance: She is not toxic-appearing.  Cardiovascular:     Rate and Rhythm: Normal rate and regular rhythm.  Pulmonary:     Effort: Pulmonary effort is normal.     Breath sounds: Normal breath sounds.  Abdominal:     General: Abdomen is flat.     Palpations: Abdomen is soft.     Tenderness: There is no abdominal tenderness.     Hernia: No hernia is present.  Skin:    General: Skin is warm and dry.     Capillary Refill: Capillary refill takes less than 2 seconds.  Neurological:     Mental Status: She is alert.     ED Results / Procedures / Treatments   Labs (all labs ordered are listed, but only  abnormal results are displayed) Labs Reviewed  COMPREHENSIVE METABOLIC PANEL - Abnormal; Notable for the following components:      Result Value   Glucose, Bld 104 (*)    AST 14 (*)    All other components within normal limits  CBC - Abnormal; Notable for the following components:   RBC 3.64 (*)    Hemoglobin 11.9 (*)    HCT 35.7 (*)    All other components within normal limits  URINALYSIS, ROUTINE W REFLEX MICROSCOPIC - Abnormal; Notable for the following components:   APPearance HAZY (*)    Hgb urine dipstick LARGE (*)    Protein, ur TRACE (*)    Bacteria, UA RARE (*)    All other components within normal limits  LIPASE, BLOOD  PREGNANCY, URINE    EKG None  Radiology CT Renal Stone Study Result Date: 04/08/2023 CLINICAL DATA:  Abdominal pain and  dysuria. EXAM: CT ABDOMEN AND PELVIS WITHOUT CONTRAST TECHNIQUE: Multidetector CT imaging of the abdomen and pelvis was performed following the standard protocol without IV contrast. RADIATION DOSE REDUCTION: This exam was performed according to the departmental dose-optimization program which includes automated exposure control, adjustment of the mA and/or kV according to patient size and/or use of iterative reconstruction technique. COMPARISON:  CT abdomen pelvis dated January 17, 2021. FINDINGS: Lower chest: No acute abnormality. Hepatobiliary: No focal liver abnormality is seen. No gallstones, gallbladder wall thickening, or biliary dilatation. Pancreas: Unremarkable. No pancreatic ductal dilatation or surrounding inflammatory changes. Spleen: Normal in size without focal abnormality. Adrenals/Urinary Tract: Adrenal glands are unremarkable. Kidneys are normal, without renal calculi, focal lesion, or hydronephrosis. Bladder is unremarkable. Stomach/Bowel: Stomach is within normal limits. Appendix appears normal. No evidence of bowel wall thickening, distention, or inflammatory changes. Vascular/Lymphatic: No significant vascular findings are present. No enlarged abdominal or pelvic lymph nodes. Reproductive: Uterus and bilateral adnexa are unremarkable. 2.6 cm dominant follicle in the right ovary. No follow-up imaging is recommended. Other: No abdominal wall hernia or abnormality. No abdominopelvic ascites. No pneumoperitoneum. Musculoskeletal: No acute or significant osseous findings. 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 IMPRESSION: 1. No acute intra-abdominal process. No urolithiasis. Electronically Signed   By: Obie Dredge M.D.   On: 04/08/2023 08:14    Procedures Procedures    Medications Ordered in ED Medications  ketorolac (TORADOL) 15 MG/ML injection 15 mg (15 mg Intramuscular Not Given 04/08/23 0919)  ondansetron  (ZOFRAN-ODT) disintegrating tablet 4 mg (4 mg Oral Given 04/08/23 0914)  dicyclomine (BENTYL) capsule 10 mg (10 mg Oral Given 04/08/23 0914)  ketorolac (TORADOL) 30 MG/ML injection 15 mg (15 mg Intramuscular Given 04/08/23 0915)    ED Course/ Medical Decision Making/ A&P Clinical Course as of 04/08/23 1610  [facility] Apr 08, 2023  0822 CT Renal Stone Study IMPRESSION: 1. No acute intra-abdominal process. No urolithiasis.   [TY]    Clinical Course User Index [TY] Coral Spikes, DO                                 Medical Decision Making This is a well-appearing 33 year old female presenting emergency department for abdominal pain.  She is afebrile nontachycardic hemodynamically stable.  Has a soft nontender abdomen.  Workup largely reassuring.  No leukocytosis to suggest systemic infection.  Comprehensive metabolic panel without significant metabolic derangement.  Normal kidney function.  No transaminitis to suggest hepatobiliary disease.  Lipase is normal.  Pancreatitis unlikely.  Pregnancy test negative; ectopic pregnancy unlikely.  UA with no evidence of urinary tract infection, but does have hematuria.  Concern for possible stone.  CT scan negative for obstructive stone.  Also appendix visualized and normal.  Patient is feeling improved since being here.  Viral gastroenteritis versus recently passed stone?.  Treated supportively with Zofran, Bentyl and Toradol.  Stable for discharge at this time.  Amount and/or Complexity of Data Reviewed Labs: ordered. Radiology: ordered. Decision-making details documented in ED Course.  Risk Prescription drug management.         Final Clinical Impression(s) / ED Diagnoses Final diagnoses:  Gastroenteritis    Rx / DC Orders ED Discharge Orders          Ordered    ondansetron (ZOFRAN) 4 MG tablet  Every 6 hours        04/08/23 0926    dicyclomine (BENTYL) 20 MG tablet  2 times daily PRN        04/08/23 0926              Coral Spikes, DO 04/08/23 [id number]

## 2023-04-08 NOTE — ED Triage Notes (Signed)
Pt bib [facility] from home reporting abd pain that began tonight. Pt endorses dysuria and urgency.

## 2023-04-08 NOTE — ED Notes (Signed)
 Patient transported to CT

## 2023-07-04 ENCOUNTER — Ambulatory Visit
Admission: EM | Admit: 2023-07-04 | Discharge: 2023-07-04 | Disposition: A | Discharge status: Home / Self Care | Attending: Physician Assistant | Admitting: Physician Assistant

## 2023-07-04 DIAGNOSIS — Z3A00 Weeks of gestation of pregnancy not specified: Secondary | ICD-10-CM | POA: Diagnosis not present

## 2023-07-04 DIAGNOSIS — O4690 Antepartum hemorrhage, unspecified, unspecified trimester: Secondary | ICD-10-CM

## 2023-07-04 LAB — POCT URINE PREGNANCY: Preg Test, Ur: POSITIVE — AB

## 2023-07-04 LAB — POCT URINALYSIS DIP (MANUAL ENTRY)
Bilirubin, UA: NEGATIVE
Blood, UA: NEGATIVE
Glucose, UA: NEGATIVE mg/dL
Ketones, POC UA: NEGATIVE mg/dL
Leukocytes, UA: NEGATIVE
Nitrite, UA: NEGATIVE
Protein Ur, POC: 30 mg/dL — AB
Spec Grav, UA: 1.02 (ref 1.010–1.025)
Urobilinogen, UA: 2 U/dL — AB
pH, UA: 7 (ref 5.0–8.0)

## 2023-07-04 NOTE — ED Triage Notes (Signed)
"  I took a pregnancy test last Wednesday it was positive, this past Tuesday I had some bleeding (with cramping), ? Miscarriage". "Abd pain/cramps". Some nausea. No vomiting. "Some clot in the bleeding, dark blood". "Nipples have been sore/tender".

## 2023-07-04 NOTE — ED Notes (Signed)
 Patient is being discharged from the Urgent Care and sent to the Emergency Department via POV . Per Aisha Ali, PA-C, patient is in need of higher level of care due to need for further evaluation of bleeding. Patient is aware and verbalizes understanding of plan of care.  Vitals:   07/04/23 1029  BP: 102/66  Pulse: 88  Resp: 18  Temp: 98 F (36.7 C)  SpO2: 100%

## 2023-07-04 NOTE — ED Provider Notes (Addendum)
 [facility]    CSN: [id number] Arrival date & time: 07/04/23  0946      History   Chief Complaint Chief Complaint  Patient presents with   Abdominal Pain   Possible Pregnancy    HPI Tracy Church is a 33 y.o. female.   Presents today for evaluation of possible miscarriage.  She reports that she has had some bleeding and cramping after taking a positive pregnancy test last week.  The history is provided by the patient.  Abdominal Pain Associated symptoms: vaginal bleeding   Associated symptoms: no chills, no fever, no nausea and no vomiting   Possible Pregnancy Associated symptoms include abdominal pain.    Past Medical History:  Diagnosis Date   Medical history non-contributory    Preterm delivery     Patient Active Problem List   Diagnosis Date Noted   Pregnancy 03/15/2018   Trichomonas infection 02/11/2018   History of preterm delivery 09/24/2017   Supervision of high risk pregnancy, antepartum 09/24/2017   History of prior pregnancy with short cervix, currently pregnant 09/24/2017    Past Surgical History:  Procedure Laterality Date   NO PAST SURGERIES      OB History     Gravida  2   Para  2   Term  1   Preterm  1   AB      Living  2      SAB      IAB      Ectopic      Multiple      Live Births  2            Home Medications    Prior to Admission medications   Medication Sig Start Date End Date Taking? Authorizing Provider  Multiple Vitamin (MULTIVITAMIN PO) Take by mouth.   Yes [provider]  cyclobenzaprine  (FLEXERIL ) 10 MG tablet Take 1 tablet (10 mg total) by mouth 2 (two) times daily as needed for muscle spasms. 12/07/21   Vernestine Gondola, PA-C  dextromethorphan-guaiFENesin  (MUCINEX  DM) 30-600 MG 12hr tablet Take 1 tablet by mouth 2 (two) times daily. 03/13/20   Wieters, Hallie C, PA-C  dicyclomine  (BENTYL ) 20 MG tablet Take 1 tablet (20 mg total) by mouth 2 (two) times daily as needed for up  to 3 days for spasms. 04/08/23 04/11/23  Rolinda Climes, DO  fluticasone  (FLONASE ) 50 MCG/ACT nasal spray Place 1-2 sprays into both nostrils daily. 03/13/20   Wieters, Hallie C, PA-C  naproxen  (NAPROSYN ) 500 MG tablet Take 1 tablet (500 mg total) by mouth 2 (two) times daily. 12/28/20   Corbin Dess, PA-C  ondansetron  (ZOFRAN  ODT) 4 MG disintegrating tablet Take 1 tablet (4 mg total) by mouth every 8 (eight) hours as needed for nausea or vomiting. 03/13/20   Wieters, Hallie C, PA-C  ondansetron  (ZOFRAN ) 4 MG tablet Take 1 tablet (4 mg total) by mouth every 6 (six) hours. 04/08/23   Rolinda Climes, DO  norethindrone  (MICRONOR ,CAMILA ,ERRIN ) 0.35 MG tablet Take 1 tablet (0.35 mg total) by mouth daily. Patient not taking: Reported on 05/17/2019 05/12/18 03/13/20  Stinson, Jacob J, DO    Family History Family History  Adopted: Yes  Problem Relation Age of Onset   Healthy Mother    Healthy Father     Social History Social History   Tobacco Use   Smoking status: Some Days    Types: Cigars    Passive exposure: Yes   Smokeless tobacco: Never  Vaping Use  Vaping status: Former  Substance Use Topics   Alcohol use: Not Currently   Drug use: Not Currently     Allergies   Patient has no known allergies.   Review of Systems Review of Systems  Constitutional:  Negative for chills and fever.  Eyes:  Negative for discharge and redness.  Gastrointestinal:  Positive for abdominal pain. Negative for nausea and vomiting.  Genitourinary:  Positive for vaginal bleeding.     Physical Exam Triage Vital Signs ED Triage Vitals  Encounter Vitals Group     BP 07/04/23 1029 102/66     Systolic BP Percentile --      Diastolic BP Percentile --      Pulse Rate 07/04/23 1029 88     Resp 07/04/23 1029 18     Temp 07/04/23 1029 98 F (36.7 C)     Temp Source 07/04/23 1029 Oral     SpO2 07/04/23 1029 100 %     Weight 07/04/23 1026 150 lb (68 kg)     Height 07/04/23 1026 5\' 6"  (1.676 m)     Head  Circumference --      Peak Flow --      Pain Score 07/04/23 1023 0     Pain Loc --      Pain Education --      Exclude from Growth Chart --    No data found.  Updated Vital Signs BP 102/66 (BP Location: Right Arm)   Pulse 88   Temp 98 F (36.7 C) (Oral)   Resp 18   Ht 5\' 6"  (1.676 m)   Wt 150 lb (68 kg)   LMP 07/02/2023 (Exact Date) Comment: "just some bleeding that day"  SpO2 100%   BMI 24.21 kg/m   Visual Acuity Right Eye Distance:   Left Eye Distance:   Bilateral Distance:    Right Eye Near:   Left Eye Near:    Bilateral Near:     Physical Exam Vitals and nursing note reviewed.  Constitutional:      General: She is not in acute distress.    Appearance: Normal appearance. She is not ill-appearing.  HENT:     Head: Normocephalic and atraumatic.  Eyes:     Conjunctiva/sclera: Conjunctivae normal.  Cardiovascular:     Rate and Rhythm: Normal rate.  Pulmonary:     Effort: Pulmonary effort is normal. No respiratory distress.  Neurological:     Mental Status: She is alert.  Psychiatric:        Mood and Affect: Mood normal.        Behavior: Behavior normal.        Thought Content: Thought content normal.      UC Treatments / Results  Labs (all labs ordered are listed, but only abnormal results are displayed) Labs Reviewed  POCT URINALYSIS DIP (MANUAL ENTRY) - Abnormal; Notable for the following components:      Result Value   Clarity, UA cloudy (*)    Protein Ur, POC =30 (*)    Urobilinogen, UA 2.0 (*)    All other components within normal limits  POCT URINE PREGNANCY - Abnormal; Notable for the following components:   Preg Test, Ur Positive (*)    All other components within normal limits    EKG   Radiology No results found.  Procedures Procedures (including critical care time)  Medications Ordered in UC Medications - No data to display  Initial Impression / Assessment and Plan / UC Course  I  have reviewed the triage vital signs and the  nursing notes.  Pertinent labs & imaging results that were available during my care of the patient were reviewed by me and considered in my medical decision making (see chart for details).    Recommended further evaluation in the emergency room for stat ultrasound.  Patient is agreeable to transport via POV.  Final Clinical Impressions(s) / UC Diagnoses   Final diagnoses:  Vaginal bleeding in pregnancy   Discharge Instructions   None    ED Prescriptions   None    PDMP not reviewed this encounter.   Vernestine Gondola, PA-C 07/04/23 1049    Vernestine Gondola, PA-C 07/04/23 1050

## 2023-07-05 ENCOUNTER — Inpatient Hospital Stay (HOSPITAL_COMMUNITY)
Admission: AD | Admit: 2023-07-05 | Discharge: 2023-07-05 | Disposition: A | Discharge status: Home / Self Care | Attending: Obstetrics and Gynecology | Admitting: Obstetrics and Gynecology

## 2023-07-05 ENCOUNTER — Inpatient Hospital Stay (HOSPITAL_COMMUNITY)

## 2023-07-05 ENCOUNTER — Encounter (HOSPITAL_COMMUNITY): Payer: Self-pay | Admitting: *Deleted

## 2023-07-05 DIAGNOSIS — O23591 Infection of other part of genital tract in pregnancy, first trimester: Secondary | ICD-10-CM | POA: Diagnosis not present

## 2023-07-05 DIAGNOSIS — O26891 Other specified pregnancy related conditions, first trimester: Secondary | ICD-10-CM

## 2023-07-05 DIAGNOSIS — O208 Other hemorrhage in early pregnancy: Secondary | ICD-10-CM | POA: Diagnosis present

## 2023-07-05 DIAGNOSIS — N760 Acute vaginitis: Secondary | ICD-10-CM | POA: Diagnosis not present

## 2023-07-05 DIAGNOSIS — B9689 Other specified bacterial agents as the cause of diseases classified elsewhere: Secondary | ICD-10-CM

## 2023-07-05 DIAGNOSIS — O418X10 Other specified disorders of amniotic fluid and membranes, first trimester, not applicable or unspecified: Secondary | ICD-10-CM

## 2023-07-05 DIAGNOSIS — Z3A10 10 weeks gestation of pregnancy: Secondary | ICD-10-CM

## 2023-07-05 LAB — WET PREP, GENITAL
Sperm: NONE SEEN
Trich, Wet Prep: NONE SEEN
WBC, Wet Prep HPF POC: <10 (ref <10)
Yeast Wet Prep HPF POC: NONE SEEN

## 2023-07-05 LAB — CBC
HCT: 35.6 % — ABNORMAL LOW (ref 36.0–46.0)
Hemoglobin: 12 g/dL (ref 12.0–15.0)
MCH: 32.2 pg (ref 26.0–34.0)
MCHC: 33.7 g/dL (ref 30.0–36.0)
MCV: 95.4 fL (ref 80.0–100.0)
Platelets: 184 10*3/uL (ref 150–400)
RBC: 3.73 MIL/uL — ABNORMAL LOW (ref 3.87–5.11)
RDW: 12.7 % (ref 11.5–15.5)
WBC: 7.2 10*3/uL (ref 4.0–10.5)
nRBC: 0 % (ref 0.0–0.2)

## 2023-07-05 LAB — HCG, QUANTITATIVE, PREGNANCY: hCG, Beta Chain, Quant, S: 16110 m[IU]/mL — ABNORMAL HIGH (ref <5)

## 2023-07-05 MED ORDER — METRONIDAZOLE 500 MG PO TABS: 500.0000 mg | ORAL_TABLET | Freq: Two times a day (BID) | ORAL | 0 refills | Status: AC

## 2023-07-05 NOTE — Discharge Instructions (Signed)
 [facility] Address: [address] [place]. [address] [place], [place] [address] Phone: [phone] Hours:  Monday 9AM-5PM Tuesday 10AM-6PM Wednesday 11AM-7PM Thursday 9AM-5PM Friday              8AM-2PM Saturday Sunday   [facility] Address: [address] [place], [place] [address] Phone: [phone] Hours:  Sunday Closed Monday 10?AM-6?PM Tuesday 9?AM-5?PM Wednesday 9?AM-5?PM Thursday 9?AM-3?PM Friday 9?AM-2?PM Saturday Closed   Safe Medications in Pregnancy   Acne: Benzoyl Peroxide Salicylic Acid  Backache/Headache: Tylenol : 2 regular strength every 4 hours OR              2 Extra strength every 6 hours  Colds/Coughs/Allergies: Benadryl  (alcohol free) 25 mg every 6 hours as needed Breath right strips Claritin Cepacol throat lozenges Chloraseptic throat spray Cold-Eeze- up to three times per day Cough drops, alcohol free Flonase  (by prescription only) Guaifenesin  Mucinex  Robitussin DM (plain only, alcohol free) Saline nasal spray/drops Sudafed (pseudoephedrine) & Actifed ** use only after [redacted] weeks gestation and if you do not have high blood pressure Tylenol  Vicks Vaporub Zinc lozenges Zyrtec   Constipation: Colace Ducolax suppositories Fleet enema Glycerin  suppositories Metamucil Milk of magnesia Miralax Senokot Smooth move tea  Diarrhea: Kaopectate Imodium A-D  *NO pepto Bismol  Hemorrhoids: Anusol Anusol HC Preparation H Tucks  Indigestion: Tums Maalox Mylanta Cimetidine (Tagamet HB)** preferred in pregnancy Famotidine  (Pepcid ) Ranitidine (Zantac)  Insomnia: Benadryl  (alcohol free) 25mg  every 6 hours as needed Tylenol  PM Unisom, no Gelcaps  Leg Cramps: Tums MagGel  Nausea/Vomiting:  Bonine Dramamine Emetrol Ginger extract Sea bands Meclizine   Nausea medication to take during pregnancy:  Unisom (doxylamine succinate 25 mg tablets) Take one  tablet daily at bedtime. If symptoms are not adequately controlled, the dose can be increased to a maximum recommended dose of two tablets daily (1/2 tablet in the morning, 1/2 tablet mid-afternoon and one at bedtime). Vitamin B6 100mg  tablets. Take one tablet twice a day (up to 200 mg per day).  Skin Rashes: Aveeno products Benadryl  cream or 25mg  every 6 hours as needed Calamine Lotion 1% cortisone cream  Yeast infection: Gyne-lotrimin 7 Monistat 7   **If taking multiple medications, please check labels to avoid duplicating the same active ingredients **take medication as directed on the label ** Do not exceed 4000 mg of tylenol  in 24 hours **Do not take medications that contain aspirin or ibuprofen 

## 2023-07-05 NOTE — MAU Provider Note (Signed)
 History     CSN: [id number]  Arrival date and time: 07/05/23 1159   Event Date/Time   First Provider Initiated Contact with Patient 07/05/2023 12:40 PM   Chief Complaint  Patient presents with   Vaginal Bleeding    HPI  Tracy Church is a 33 y.o. [id number] at [redacted]w[redacted]d who presents to the MAU for vaginal bleeding. Started two days ago, she reports VB have since resolved. No VB today. She was also having mild cramping, but none today. This pregnancy was a surprise, she is uncertain about her desire to continue pregnancy. She denies any other complaints today.   Past Medical History:  Diagnosis Date   Medical history non-contributory    Preterm delivery     Past Surgical History:  Procedure Laterality Date   NO PAST SURGERIES      Family History  Adopted: Yes  Problem Relation Age of Onset   Healthy Mother    Healthy Father     Social History   Tobacco Use   Smoking status: Some Days    Types: Cigars    Passive exposure: Yes   Smokeless tobacco: Never  Vaping Use   Vaping status: Former  Substance Use Topics   Alcohol use: Not Currently   Drug use: Not Currently    Allergies: No Known Allergies  No medications prior to admission.    ROS reviewed and pertinent positives and negatives as documented in HPI.  Physical Exam   Blood pressure (!) 122/57, pulse 67, temperature 98 F (36.7 C), resp. rate 18, height 5\' 6"  (1.676 m), weight 63 kg, last menstrual period 04/20/2023, unknown if currently breastfeeding.  Physical Exam Constitutional:      General: She is not in acute distress.    Appearance: Normal appearance. She is not ill-appearing.  HENT:     Head: Normocephalic and atraumatic.  Cardiovascular:     Rate and Rhythm: Normal rate.  Pulmonary:     Effort: Pulmonary effort is normal.     Breath sounds: Normal breath sounds.  Abdominal:     Palpations: Abdomen is soft.     Tenderness: There is no abdominal tenderness. There is no guarding.   Musculoskeletal:        General: Normal range of motion.  Skin:    General: Skin is warm and dry.     Findings: No rash.  Neurological:     General: No focal deficit present.     Mental Status: She is alert and oriented to person, place, and time.     MAU Course  Procedures  MDM 33 y.o. [id number] at [redacted]w[redacted]d presenting for VB in early pregnancy. She is well appearing, no acute distress, has benign abdominal exam. Her labs are notable for Rh positive status, HgB 12.0, wet prep is pos for BV, and U/S showing SIUP w EGA [redacted]w[redacted]d, moderate subchorionic hematoma and embryonic bradycardia. Discussed results w pt in detail, including increased risk of sAB given large [facility]. She is understanding of results. Will Rx Flagyl  for BV. She is considering termination of pregnancy. Resources provided.   Assessment and Plan     ICD-10-CM   1. BV (bacterial vaginosis)  N76.0    B96.89     2. Subchorionic hematoma in first trimester, single or unspecified fetus  O41.8X10    O46.8X1     Rx for Flagyl  sent Discussed implications of large [facility] Message sent to [facility] to help pt schedule f/up Return precautions addressed Stable for d/c    Riane Rung  Hubert Madden, MD OB Fellow, Faculty Practice [facility]  07/05/2023, 5:32 PM

## 2023-07-05 NOTE — MAU Note (Signed)
 MAU Triage Note  .[name] is a 33 y.o. at [redacted]w[redacted]d here in MAU reporting: has had some vag bleeding for a few days. Went to ED yesterday and was told she wa pregnant . No further testing. Pt denies any pain today and only spotting.   LMP: 04/20/2023 Onset of complaint: 2-3 days Pain score: 0 Vitals:   07/05/23 1244  BP: (!) 122/57  Pulse: 67  Resp: 18  Temp: 98 F (36.7 C)     FHT:   Lab orders placed from triage: U/A , Wet prep, gc

## 2023-07-08 LAB — GC/CHLAMYDIA PROBE AMP (~~LOC~~) NOT AT ARMC
Chlamydia: NEGATIVE
Comment: NEGATIVE
Comment: NORMAL
Neisseria Gonorrhea: NEGATIVE

## 2023-08-21 ENCOUNTER — Ambulatory Visit: Admitting: Family Medicine

## 2023-08-30 ENCOUNTER — Inpatient Hospital Stay (HOSPITAL_COMMUNITY)

## 2023-08-30 ENCOUNTER — Inpatient Hospital Stay (HOSPITAL_COMMUNITY)
Admission: AD | Admit: 2023-08-30 | Discharge: 2023-08-31 | Disposition: A | Discharge status: Home / Self Care | Attending: Obstetrics and Gynecology | Admitting: Obstetrics and Gynecology

## 2023-08-30 DIAGNOSIS — Z3A19 19 weeks gestation of pregnancy: Secondary | ICD-10-CM | POA: Diagnosis not present

## 2023-08-30 DIAGNOSIS — O036 Delayed or excessive hemorrhage following complete or unspecified spontaneous abortion: Secondary | ICD-10-CM | POA: Diagnosis not present

## 2023-08-30 DIAGNOSIS — O031 Delayed or excessive hemorrhage following incomplete spontaneous abortion: Secondary | ICD-10-CM | POA: Insufficient documentation

## 2023-08-30 DIAGNOSIS — O209 Hemorrhage in early pregnancy, unspecified: Secondary | ICD-10-CM

## 2023-08-30 LAB — CBC
HCT: 28.1 % — ABNORMAL LOW (ref 36.0–46.0)
Hemoglobin: 9.1 g/dL — ABNORMAL LOW (ref 12.0–15.0)
MCH: 31.9 pg (ref 26.0–34.0)
MCHC: 32.4 g/dL (ref 30.0–36.0)
MCV: 98.6 fL (ref 80.0–100.0)
Platelets: 187 10*3/uL (ref 150–400)
RBC: 2.85 MIL/uL — ABNORMAL LOW (ref 3.87–5.11)
RDW: 13.4 % (ref 11.5–15.5)
WBC: 6.6 10*3/uL (ref 4.0–10.5)
nRBC: 0 % (ref 0.0–0.2)

## 2023-08-30 LAB — COMPREHENSIVE METABOLIC PANEL WITH GFR
ALT: 21 U/L (ref 0–44)
AST: 22 U/L (ref 15–41)
Albumin: 3.6 g/dL (ref 3.5–5.0)
Alkaline Phosphatase: 43 U/L (ref 38–126)
Anion gap: 6 (ref 5–15)
BUN: 6 mg/dL (ref 6–20)
CO2: 25 mmol/L (ref 22–32)
Calcium: 9 mg/dL (ref 8.9–10.3)
Chloride: 106 mmol/L (ref 98–111)
Creatinine, Ser: 0.75 mg/dL (ref 0.44–1.00)
GFR, Estimated: >60 mL/min (ref >60)
Glucose, Bld: 124 mg/dL — ABNORMAL HIGH (ref 70–99)
Potassium: 3.8 mmol/L (ref 3.5–5.1)
Sodium: 137 mmol/L (ref 135–145)
Total Bilirubin: 0.3 mg/dL (ref 0.0–1.2)
Total Protein: 6.5 g/dL (ref 6.5–8.1)

## 2023-08-30 LAB — ABO/RH: ABO/RH(D): O POS

## 2023-08-30 LAB — HCG, QUANTITATIVE, PREGNANCY: hCG, Beta Chain, Quant, S: 11 m[IU]/mL — ABNORMAL HIGH (ref <5)

## 2023-08-30 NOTE — MAU Provider Note (Signed)
 Chief Complaint: Vaginal Bleeding and Headache   None     SUBJECTIVE HPI: Tracy Church is a 33 y.o. [id number] who is 6 weeks s/p medical termination with medicines taken on 07/15/23. Bleeding after the abortion stopped for a few days, then was light and intermittent for a few weeks, but started as heavy with large clots yesterday, 08/29/23.  She reports associated headache today.  She denies dizziness or shortness of breath.   HPI  Past Medical History:  Diagnosis Date   Medical history non-contributory    Preterm delivery    Past Surgical History:  Procedure Laterality Date   NO PAST SURGERIES     Social History   Socioeconomic History   Marital status: Single    Spouse name: Not on file   Number of children: Not on file   Years of education: Not on file   Highest education level: Not on file  Occupational History   Not on file  Tobacco Use   Smoking status: Some Days    Types: Cigars    Passive exposure: Yes   Smokeless tobacco: Never  Vaping Use   Vaping status: Former  Substance and Sexual Activity   Alcohol use: Not Currently   Drug use: Not Currently   Sexual activity: Yes    Birth control/protection: None  Other Topics Concern   Not on file  Social History Narrative   Not on file   Social Drivers of Health   Financial Resource Strain: Not on file  Food Insecurity: Not on file  Transportation Needs: Not on file  Physical Activity: Not on file  Stress: Not on file  Social Connections: Not on file  Intimate Partner Violence: Not on file   No current facility-administered medications on file prior to encounter.   Current Outpatient Medications on File Prior to Encounter  Medication Sig Dispense Refill   cyclobenzaprine  (FLEXERIL ) 10 MG tablet Take 1 tablet (10 mg total) by mouth 2 (two) times daily as needed for muscle spasms. 20 tablet 0   fluticasone  (FLONASE ) 50 MCG/ACT nasal spray Place 1-2 sprays into both nostrils daily. 16 g 0   Multiple  Vitamin (MULTIVITAMIN PO) Take by mouth.     ondansetron  (ZOFRAN  ODT) 4 MG disintegrating tablet Take 1 tablet (4 mg total) by mouth every 8 (eight) hours as needed for nausea or vomiting. 20 tablet 0   [DISCONTINUED] norethindrone  (MICRONOR ,CAMILA ,ERRIN ) 0.35 MG tablet Take 1 tablet (0.35 mg total) by mouth daily. (Patient not taking: Reported on 05/17/2019) 1 Package 11   No Known Allergies  ROS:  Review of Systems  Constitutional:  Negative for chills, fatigue and fever.  Eyes:  Negative for visual disturbance.  Respiratory:  Negative for shortness of breath.   Cardiovascular:  Negative for chest pain.  Gastrointestinal:  Negative for abdominal pain, nausea and vomiting.  Genitourinary:  Positive for vaginal bleeding. Negative for difficulty urinating, dysuria, flank pain, pelvic pain, vaginal discharge and vaginal pain.  Neurological:  Positive for headaches. Negative for dizziness.  Psychiatric/Behavioral: Negative.       I have reviewed patient's Past Medical Hx, Surgical Hx, Family Hx, Social Hx, medications and allergies.   Physical Exam  Patient Vitals for the past 24 hrs:  BP Temp Temp src Pulse Resp Height Weight  08/31/23 0045 -- -- -- -- 12 -- --  08/30/23 2038 (!) 95/53 98.4 F (36.9 C) Oral 81 12 5' 6 (1.676 m) 61.5 kg   Constitutional: Well-developed, well-nourished female in no acute distress.  Cardiovascular: normal rate Respiratory: normal effort GI: Abd soft, non-tender. Pos BS x 4 MS: Extremities nontender, no edema, normal ROM Neurologic: Alert and oriented x 4.  GU: Neg CVAT.  PELVIC EXAM: Cervix pink, visually closed, without lesion, scant white creamy discharge, vaginal walls and external genitalia normal Bimanual exam: Cervix 0/long/high, firm, anterior, neg CMT, uterus nontender, nonenlarged, adnexa without tenderness, enlargement, or mass    LAB RESULTS Results for orders placed or performed during the hospital encounter of 08/30/23 (from the past  24 hours)  CBC     Status: Abnormal   Collection Time: 08/30/23  8:06 PM  Result Value Ref Range   WBC 6.6 4.0 - 10.5 K/uL   RBC 2.85 (L) 3.87 - 5.11 MIL/uL   Hemoglobin 9.1 (L) 12.0 - 15.0 g/dL   HCT 71.8 (L) 63.9 - 53.9 %   MCV 98.6 80.0 - 100.0 fL   MCH 31.9 26.0 - 34.0 pg   MCHC 32.4 30.0 - 36.0 g/dL   RDW 86.5 88.4 - 84.4 %   Platelets 187 150 - 400 K/uL   nRBC 0.0 0.0 - 0.2 %  Comprehensive metabolic panel with GFR     Status: Abnormal   Collection Time: 08/30/23  8:06 PM  Result Value Ref Range   Sodium 137 135 - 145 mmol/L   Potassium 3.8 3.5 - 5.1 mmol/L   Chloride 106 98 - 111 mmol/L   CO2 25 22 - 32 mmol/L   Glucose, Bld 124 (H) 70 - 99 mg/dL   BUN 6 6 - 20 mg/dL   Creatinine, Ser 9.24 0.44 - 1.00 mg/dL   Calcium 9.0 8.9 - 89.6 mg/dL   Total Protein 6.5 6.5 - 8.1 g/dL   Albumin 3.6 3.5 - 5.0 g/dL   AST 22 15 - 41 U/L   ALT 21 0 - 44 U/L   Alkaline Phosphatase 43 38 - 126 U/L   Total Bilirubin 0.3 0.0 - 1.2 mg/dL   GFR, Estimated >39 >39 mL/min   Anion gap 6 5 - 15  hCG, quantitative, pregnancy     Status: Abnormal   Collection Time: 08/30/23  8:06 PM  Result Value Ref Range   hCG, Beta Chain, Quant, S 11 (H) <5 mIU/mL  ABO/Rh     Status: None   Collection Time: 08/30/23  8:06 PM  Result Value Ref Range   ABO/RH(D)      O POS Performed at [facility], [address], [place], [place] [address]     --/--/O POS Performed at [facility], [address], [place], [place] [address]  [phone]/27 2006)  IMAGING US  PELVIC COMPLETE WITH TRANSVAGINAL Result Date: 08/30/2023 CLINICAL DATA:  Status post abortion 07/15/2023, continued vaginal bleeding. Beta HCG 11. EXAM: TRANSABDOMINAL AND TRANSVAGINAL ULTRASOUND OF PELVIS TECHNIQUE: Both transabdominal and transvaginal ultrasound examinations of the pelvis were performed. Transabdominal technique was performed for global imaging of the pelvis including uterus, ovaries, adnexal regions, and pelvic cul-de-sac.  It was necessary to proceed with endovaginal exam following the transabdominal exam to visualize the endometrium. COMPARISON:  Obstetric ultrasound 07/05/2023 FINDINGS: Uterus Measurements: 8.4 x 4.2 x 6.6 cm = volume: 122 mL. The uterus is anteverted. No fibroids or other mass visualized. Previous intrauterine gestational sac containing an embryo is no longer seen. Endometrium Thickness: 12.6 mm. Heterogeneous, complex with diffusely increased vascularity, particularly in the fundal endometrium. Right ovary Measurements: 3.2 x 3 x 2.4 cm = volume: 12.3 mL. Multiple follicles. No cyst  or solid lesion. Blood flow is demonstrated. No adnexal mass. Left ovary Measurements: 3.6 x 2.2 x 2.3 cm = volume: 9.8 mL. Normal appearance. No cystic or solid lesion. No adnexal mass. Other findings No abnormal free fluid. IMPRESSION: 1. The previous intrauterine pregnancy is no longer seen. 2. Endometrial thickening with prominent vascularity. Sonographic findings are compatible with retained products of conception in the appropriate clinical setting Electronically Signed   By: Andrea Gasman M.D.   On: 08/30/2023 21:50    MAU Management/MDM: Orders Placed This Encounter  Procedures   US  PELVIC COMPLETE WITH TRANSVAGINAL   CBC   Comprehensive metabolic panel with GFR   hCG, quantitative, pregnancy   ABO/Rh   Discharge patient Discharge disposition: 01-Home or Self Care; Discharge patient date: 08/31/2023    No orders of the defined types were placed in this encounter.   US  c/w retained products. Pt stable with minimal/moderate bleeding. Cannot completely rule out an ectopic pregnancy so hcg today and pt to return to [facility] hcg on 6/1. Hgb is low at 9.1.  Low suspicion for ectopic  ASSESSMENT 1. Vaginal bleeding in pregnancy, first trimester   2. Abortion complicated with hemorrhage   3. [redacted] weeks gestation of pregnancy     PLAN Discharge home Allergies as of 08/31/2023   No Known Allergies       Medication List     TAKE these medications    cyclobenzaprine  10 MG tablet Commonly known as: FLEXERIL  Take 1 tablet (10 mg total) by mouth 2 (two) times daily as needed for muscle spasms.   fluticasone  50 MCG/ACT nasal spray Commonly known as: FLONASE  Place 1-2 sprays into both nostrils daily.   MULTIVITAMIN PO Take by mouth.   ondansetron  4 MG disintegrating tablet Commonly known as: Zofran  ODT Take 1 tablet (4 mg total) by mouth every 8 (eight) hours as needed for nausea or vomiting.        Follow-up Information     [facility] Follow up.   Specialty: Obstetrics and Gynecology Why: As needed for emergencies Contact information: [address] [place]  [address] [phone]                Olam Boards Certified Nurse-Midwife 08/31/2023  4:51 AM

## 2023-08-30 NOTE — MAU Note (Signed)
 Pt says she is leaving - will come back in am

## 2023-08-30 NOTE — MAU Note (Addendum)
 Pt says she has VB - started yesterday - at 6pm- started small and gotten worse.  In Triage - light - to mod on pad .  No cramping - no pain. .  Pt [facility] [place].  Was suppose to go back for follow-up on 07-26-2023- she missed that appointment -  But  - went back on 07-28-2023 -positive UPT .  Had spotting from TAB .  Has H/A - Took XS Tyl 1 tab

## 2023-08-31 DIAGNOSIS — O036 Delayed or excessive hemorrhage following complete or unspecified spontaneous abortion: Secondary | ICD-10-CM

## 2023-08-31 DIAGNOSIS — Z3A19 19 weeks gestation of pregnancy: Secondary | ICD-10-CM

## 2023-09-02 ENCOUNTER — Ambulatory Visit: Payer: Self-pay

## 2023-09-02 NOTE — Progress Notes (Deleted)
 Beta HCG Follow-up Visit  Tracy Church presents to [facility] for follow-up beta HCG lab. She was seen in MAU for heavy vaginal bleeding on 08/30/23 s/p medical termination on 07/15/23. Patient {Actions; denies-reports:120008} {pain/bleeding:25812} today. Discussed with patient that we are following beta HCG levels today. Results will be back in approximately 2 hours. Valid contact number for patient confirmed. I will call the patient with results.   Beta HCG results: 08/30/23 11         Results and patient history reviewed with ***, who states ***. Patient called and informed of plan for follow-up.  Waddell LITTIE Burows 09/02/2023 8:12 AM

## 2023-09-10 ENCOUNTER — Ambulatory Visit: Admitting: Family Medicine

## 2023-09-10 VITALS — BP 115/78 | HR 79 | Temp 98.0°F | Wt 133.0 lb

## 2023-09-10 DIAGNOSIS — Z3A00 Weeks of gestation of pregnancy not specified: Secondary | ICD-10-CM

## 2023-09-10 DIAGNOSIS — R519 Headache, unspecified: Secondary | ICD-10-CM

## 2023-09-10 DIAGNOSIS — O034 Incomplete spontaneous abortion without complication: Secondary | ICD-10-CM

## 2023-09-10 MED ORDER — OXYCODONE HCL 5 MG PO TABS: 5.0000 mg | ORAL_TABLET | ORAL | 0 refills | Status: DC | PRN

## 2023-09-10 MED ORDER — MISOPROSTOL 200 MCG PO TABS: 800.0000 ug | ORAL_TABLET | Freq: Once | ORAL | 0 refills | Status: AC

## 2023-09-10 MED ORDER — ONDANSETRON 4 MG PO TBDP: 4.0000 mg | ORAL_TABLET | Freq: Four times a day (QID) | ORAL | 0 refills | Status: DC | PRN

## 2023-09-10 MED ORDER — CYCLOBENZAPRINE HCL 10 MG PO TABS: 10.0000 mg | ORAL_TABLET | Freq: Three times a day (TID) | ORAL | 1 refills | Status: AC | PRN

## 2023-09-10 NOTE — Assessment & Plan Note (Addendum)
 US  images from 08/30/2023, definitely consistent with retained products and patient continues to have heavy vaginal bleeding. At this point discussed options are repeat medication management or proceed with surgical management.  After counseling she elected for medical management. Rx sent for misoprostol  800 mcg buccal, zofran  ODT, and 4 tabs of 5mg  Oxycodone . Reviewed that cramping, bleeding are normal in the first few hours after taking the medication, but should eventually wane. Reviewed warning signs of heavy vaginal bleeding soaking through >1 pad per hour, crescendo abdominal pain, and fever. . Blood type --/--/O POS Performed at [facility], [address], [place], [place] [address]  (06/27 2006), rhogam was not indicated. We discussed return precautions including crescendo abdominal pain, heavy vaginal bleeding soaking >1 pad/hour, and fever. Will see her back next week for US  and follow up visit.  Recheck CBC and hcg today. Trial flexeril  for headache.

## 2023-09-10 NOTE — Progress Notes (Signed)
 GYNECOLOGY OFFICE VISIT NOTE  History:   Tracy Church is a 33 y.o. [id number] here today for vaginal bleeding.  Seen in MAU on 07/05/2023, had vaginal bleeding and diagnosed with IUP at that time Seen again on 08/30/2023, at that time reported having a termination but heavy bleeding. US  showed retained POC's, hcg 11, treated as PUL with plan to recheck HCG in 2 days Was not able to come to f/u lab visit  Today reports she continues to have bleeding, slightly more heavy than her usual period Also having some headaches, mainly frontal, not responding to tylenol  or excedrin No cramping or abdominal pain at present  Health Maintenance Due  Topic Date Due   Hepatitis C Screening  Never done   Pneumococcal Vaccine 41-49 Years old (1 of 2 - PCV) Never done   Hepatitis B Vaccines (1 of 3 - 19+ 3-dose series) Never done   HPV VACCINES (1 - 3-dose SCDM series) Never done   Cervical Cancer Screening (HPV/Pap Cotest)  Never done   COVID-19 Vaccine (1 - 2024-25 season) Never done    Past Medical History:  Diagnosis Date   Medical history non-contributory    Preterm delivery     Past Surgical History:  Procedure Laterality Date   NO PAST SURGERIES      The following portions of the patient's history were reviewed and updated as appropriate: allergies, current medications, past family history, past medical history, past social history, past surgical history and problem list.   Health Maintenance:   Last pap: No Cervical Cancer Screening results to display. needs  Last mammogram:  N/a    Review of Systems:  Pertinent items noted in HPI and remainder of comprehensive ROS otherwise negative.  Physical Exam:  BP 115/78   Pulse 79   Temp 98 F (36.7 C)   Wt 133 lb (60.3 kg)   LMP 04/20/2023 (Within Weeks) Comment: just some bleeding that day  SpO2 100%   BMI 21.47 kg/m  CONSTITUTIONAL: Well-developed, well-nourished female in no acute distress.  HEENT:  Normocephalic,  atraumatic. External right and left ear normal. No scleral icterus.  NECK: Normal range of motion, supple, no masses noted on observation SKIN: No rash noted. Not diaphoretic. No erythema. No pallor. MUSCULOSKELETAL: Normal range of motion. No edema noted. NEUROLOGIC: Alert and oriented to person, place, and time. Normal muscle tone coordination.  PSYCHIATRIC: Normal mood and affect. Normal behavior. Normal judgment and thought content. RESPIRATORY: Effort normal, no problems with respiration noted  Labs and Imaging No results found for this or any previous visit (from the past week). US  PELVIC COMPLETE WITH TRANSVAGINAL Result Date: 08/30/2023 CLINICAL DATA:  Status post abortion 07/15/2023, continued vaginal bleeding. Beta HCG 11. EXAM: TRANSABDOMINAL AND TRANSVAGINAL ULTRASOUND OF PELVIS TECHNIQUE: Both transabdominal and transvaginal ultrasound examinations of the pelvis were performed. Transabdominal technique was performed for global imaging of the pelvis including uterus, ovaries, adnexal regions, and pelvic cul-de-sac. It was necessary to proceed with endovaginal exam following the transabdominal exam to visualize the endometrium. COMPARISON:  Obstetric ultrasound 07/05/2023 FINDINGS: Uterus Measurements: 8.4 x 4.2 x 6.6 cm = volume: 122 mL. The uterus is anteverted. No fibroids or other mass visualized. Previous intrauterine gestational sac containing an embryo is no longer seen. Endometrium Thickness: 12.6 mm. Heterogeneous, complex with diffusely increased vascularity, particularly in the fundal endometrium. Right ovary Measurements: 3.2 x 3 x 2.4 cm = volume: 12.3 mL. Multiple follicles. No cyst or solid lesion. Blood flow is demonstrated. No adnexal  mass. Left ovary Measurements: 3.6 x 2.2 x 2.3 cm = volume: 9.8 mL. Normal appearance. No cystic or solid lesion. No adnexal mass. Other findings No abnormal free fluid. IMPRESSION: 1. The previous intrauterine pregnancy is no longer seen. 2.  Endometrial thickening with prominent vascularity. Sonographic findings are compatible with retained products of conception in the appropriate clinical setting Electronically Signed   By: Andrea Gasman M.D.   On: 08/30/2023 21:50      Assessment and Plan:   Problem List Items Addressed This Visit       Other   Retained products of conception following abortion - Primary   US  images from 08/30/2023, definitely consistent with retained products and patient continues to have heavy vaginal bleeding. At this point discussed options are repeat medication management or proceed with surgical management.  After counseling she elected for medical management. Rx sent for misoprostol  800 mcg buccal, zofran  ODT, and 4 tabs of 5mg  Oxycodone . Reviewed that cramping, bleeding are normal in the first few hours after taking the medication, but should eventually wane. Reviewed warning signs of heavy vaginal bleeding soaking through >1 pad per hour, crescendo abdominal pain, and fever. . Blood type --/--/O POS Performed at [facility], [address] [place], [place] [address]  (06/27 2006), rhogam was not indicated. We discussed return precautions including crescendo abdominal pain, heavy vaginal bleeding soaking >1 pad/hour, and fever. Will see her back next week for US  and follow up visit.  Recheck CBC and hcg today. Trial flexeril  for headache.       Relevant Medications   ondansetron  (ZOFRAN -ODT) 4 MG disintegrating tablet   oxyCODONE  (OXY IR/ROXICODONE ) 5 MG immediate release tablet   misoprostol  (CYTOTEC ) 200 MCG tablet   Other Relevant Orders   CBC   Beta hCG quant (ref lab)   US  OB LESS THAN 14 WEEKS WITH OB TRANSVAGINAL   Other Visit Diagnoses       Nonintractable headache, unspecified chronicity pattern, unspecified headache type       Relevant Medications   oxyCODONE  (OXY IR/ROXICODONE ) 5 MG immediate release tablet   cyclobenzaprine  (FLEXERIL ) 10 MG tablet       Routine  preventative health maintenance measures emphasized. Please refer to After Visit Summary for other counseling recommendations.   Return in about 1 week (around 09/17/2023).    Total face-to-face time with patient: 20 minutes.  Over 50% of encounter was spent on counseling and coordination of care.   Donnice CHRISTELLA Carolus, MD/MPH Attending Family Medicine Physician, [facility] for [facility], [facility]

## 2023-09-10 NOTE — Patient Instructions (Signed)
 Your ultrasound shows you have retained pieces of the pregnancy which is why you continue to have bleeding.  We reviewed options of medical vs surgical management. After counseling you elected for medical management. I have sent a prescription for misoprostol  (medicine to open your cervix and cause contractions), zofran  (nausea medicine), and 4 tabs of 5mg  Oxycodone  (strong opioid pain medicine). We reviewed that cramping and bleeding are normal in the first few hours after taking the medication, but should eventually wane. We discussed return precautions including crescendo abdominal pain, heavy vaginal bleeding soaking >1 pad/hour, and fever.

## 2023-09-11 ENCOUNTER — Ambulatory Visit: Payer: Self-pay | Admitting: Family Medicine

## 2023-09-11 DIAGNOSIS — O034 Incomplete spontaneous abortion without complication: Secondary | ICD-10-CM

## 2023-09-11 LAB — CBC
Hematocrit: 27.2 % — ABNORMAL LOW (ref 34.0–46.6)
Hemoglobin: 8.5 g/dL — ABNORMAL LOW (ref 11.1–15.9)
MCH: 31 pg (ref 26.6–33.0)
MCHC: 31.3 g/dL — ABNORMAL LOW (ref 31.5–35.7)
MCV: 99 fL — ABNORMAL HIGH (ref 79–97)
Platelets: 243 x10E3/uL (ref 150–450)
RBC: 2.74 x10E6/uL — CL (ref 3.77–5.28)
RDW: 13 % (ref 11.7–15.4)
WBC: 4.7 x10E3/uL (ref 3.4–10.8)

## 2023-09-11 LAB — BETA HCG QUANT (REF LAB): hCG Quant: 4 m[IU]/mL

## 2023-09-11 MED ORDER — FERROUS SULFATE 325 (65 FE) MG PO TBEC: 325.0000 mg | DELAYED_RELEASE_TABLET | ORAL | 2 refills | Status: AC

## 2023-09-24 ENCOUNTER — Ambulatory Visit: Admitting: Obstetrics and Gynecology

## 2023-09-24 ENCOUNTER — Ambulatory Visit (INDEPENDENT_AMBULATORY_CARE_PROVIDER_SITE_OTHER)

## 2023-09-24 ENCOUNTER — Encounter: Payer: Self-pay | Admitting: Obstetrics and Gynecology

## 2023-09-24 DIAGNOSIS — O034 Incomplete spontaneous abortion without complication: Secondary | ICD-10-CM

## 2023-09-24 DIAGNOSIS — Z3A00 Weeks of gestation of pregnancy not specified: Secondary | ICD-10-CM

## 2023-09-24 DIAGNOSIS — Z3A01 Less than 8 weeks gestation of pregnancy: Secondary | ICD-10-CM | POA: Diagnosis not present

## 2023-09-24 DIAGNOSIS — Z0289 Encounter for other administrative examinations: Secondary | ICD-10-CM
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
## 2023-10-01 ENCOUNTER — Other Ambulatory Visit (HOSPITAL_COMMUNITY)
Admission: RE | Admit: 2023-10-01 | Discharge: 2023-10-01 | Disposition: A | Discharge status: Home / Self Care | Source: Ambulatory Visit | Attending: Family Medicine | Admitting: Family Medicine

## 2023-10-01 ENCOUNTER — Encounter: Payer: Self-pay | Admitting: Family Medicine

## 2023-10-01 ENCOUNTER — Ambulatory Visit: Admitting: Family Medicine

## 2023-10-01 ENCOUNTER — Other Ambulatory Visit (HOSPITAL_COMMUNITY): Payer: Self-pay

## 2023-10-01 VITALS — BP 108/66 | HR 99 | Wt 136.2 lb

## 2023-10-01 DIAGNOSIS — O034 Incomplete spontaneous abortion without complication: Secondary | ICD-10-CM

## 2023-10-01 MED ORDER — ONDANSETRON 4 MG PO TBDP
4.0000 mg | ORAL_TABLET | Freq: Four times a day (QID) | ORAL | 0 refills | Status: AC | PRN
  Filled 2023-10-01: qty 20, 5d supply, fill #0

## 2023-10-01 MED ORDER — OXYCODONE HCL 5 MG PO TABS
5.0000 mg | ORAL_TABLET | ORAL | 0 refills | Status: AC | PRN
  Filled 2023-10-01: qty 4, 1d supply, fill #0

## 2023-10-01 MED ORDER — DOXYCYCLINE HYCLATE 100 MG PO CAPS
200.0000 mg | ORAL_CAPSULE | Freq: Once | ORAL | 0 refills | Status: AC
  Filled 2023-10-01: qty 2, 1d supply, fill #0
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
## 2023-10-03 ENCOUNTER — Ambulatory Visit: Payer: Self-pay | Admitting: Family Medicine

## 2023-10-03 DIAGNOSIS — O8689 Other specified puerperal infections: Secondary | ICD-10-CM

## 2023-10-03 LAB — SURGICAL PATHOLOGY

## 2023-10-03 MED ORDER — DOXYCYCLINE HYCLATE 100 MG PO CAPS
100.0000 mg | ORAL_CAPSULE | Freq: Two times a day (BID) | ORAL | 0 refills | Status: AC
Start: 2023-10-03 — End: 2023-10-10
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
## 2024-01-08 ENCOUNTER — Ambulatory Visit: Admission: EM | Admit: 2024-01-08 | Discharge: 2024-01-08 | Disposition: A | Discharge status: Home / Self Care

## 2024-01-08 ENCOUNTER — Encounter: Payer: Self-pay | Admitting: Emergency Medicine

## 2024-01-08 DIAGNOSIS — R3589 Other polyuria: Secondary | ICD-10-CM

## 2024-01-08 LAB — POCT URINE DIPSTICK
Bilirubin, UA: NEGATIVE
Blood, UA: NEGATIVE
Glucose, UA: NEGATIVE mg/dL
Ketones, POC UA: NEGATIVE mg/dL
Leukocytes, UA: NEGATIVE
Nitrite, UA: NEGATIVE
POC PROTEIN,UA: NEGATIVE
Spec Grav, UA: 1.025 (ref 1.010–1.025)
Urobilinogen, UA: 1 U/dL
pH, UA: 6.5 (ref 5.0–8.0)

## 2024-01-08 LAB — GLUCOSE, POCT (MANUAL RESULT ENTRY): POCT Glucose (KUC): 95 mg/dL (ref 70–99)

## 2024-01-08 LAB — POCT URINE PREGNANCY: Preg Test, Ur: NEGATIVE

## 2024-01-08 NOTE — ED Provider Notes (Signed)
 [facility]    CSN: [id number] Arrival date & time: 01/08/24  1051      History   Chief Complaint Chief Complaint  Patient presents with   Urinary Frequency    HPI Tracy Church is a 33 y.o. female.   Patient presents today due to dysuria for the past 4 days.  Patient denies dysuria, back pain, nausea, or vomiting.  Patient does admit to drinking a lot of soda mostly [facility].  Patient admits to some increased thirst and increased hunger.  The history is provided by the patient.  Urinary Frequency    Past Medical History:  Diagnosis Date   Medical history non-contributory    Preterm delivery     Patient Active Problem List   Diagnosis Date Noted   Retained products of conception following abortion 09/10/2023   Pregnancy 03/15/2018   History of preterm delivery 09/24/2017   History of prior pregnancy with short cervix, currently pregnant 09/24/2017    Past Surgical History:  Procedure Laterality Date   NO PAST SURGERIES      OB History     Gravida  3   Para  2   Term  1   Preterm  1   AB      Living  2      SAB      IAB      Ectopic      Multiple      Live Births  2            Home Medications    Prior to Admission medications   Medication Sig Start Date End Date Taking? Authorizing Provider  dicyclomine  (BENTYL ) 20 MG tablet Take 20 mg by mouth. 04/08/23  Yes [provider]  ibuprofen  (ADVIL ) 800 MG tablet Take by mouth. 07/15/23  Yes [provider]  promethazine  (PHENERGAN ) 25 MG tablet Take by mouth. 07/15/23  Yes [provider]  cyclobenzaprine  (FLEXERIL ) 10 MG tablet Take 1 tablet (10 mg total) by mouth every 8 (eight) hours as needed for muscle spasms. 09/10/23   Lola Donnice HERO, MD  ferrous sulfate  325 (65 FE) MG EC tablet Take 1 tablet (325 mg total) by mouth every other day. 09/11/23 03/09/24  Lola Donnice HERO, MD  misoprostol  (CYTOTEC ) 200 MCG tablet Take 4 tablets (800 mcg  total) by mouth once for 1 dose. 09/10/23 09/10/23  Lola Donnice HERO, MD  ondansetron  (ZOFRAN -ODT) 4 MG disintegrating tablet Dissolve 1 tablet (4 mg total) in mouth every 6 (six) hours as needed for nausea. 10/01/23   Lola Donnice HERO, MD  oxyCODONE  (OXY IR/ROXICODONE ) 5 MG immediate release tablet Take 1 tablet (5 mg total) by mouth every 4 (four) hours as needed for severe pain (pain score 7-10) or breakthrough pain. 10/01/23   Lola Donnice HERO, MD  norethindrone  (MICRONOR ,CAMILA ,ERRIN ) 0.35 MG tablet Take 1 tablet (0.35 mg total) by mouth daily. Patient not taking: Reported on 05/17/2019 05/12/18 03/13/20  Stinson, Jacob J, DO    Family History Family History  Adopted: Yes  Problem Relation Age of Onset   Healthy Mother    Healthy Father     Social History Social History   Tobacco Use   Smoking status: Some Days    Types: Cigars    Passive exposure: Yes   Smokeless tobacco: Never  Vaping Use   Vaping status: Former  Substance Use Topics   Alcohol use: Not Currently   Drug use: Not Currently     Allergies  Patient has no known allergies.   Review of Systems Review of Systems  Genitourinary:  Positive for frequency.     Physical Exam Triage Vital Signs ED Triage Vitals  Encounter Vitals Group     BP 01/08/24 1103 135/76     Girls Systolic BP Percentile --      Girls Diastolic BP Percentile --      Boys Systolic BP Percentile --      Boys Diastolic BP Percentile --      Pulse Rate 01/08/24 1103 71     Resp 01/08/24 1103 16     Temp 01/08/24 1103 98.1 F (36.7 C)     Temp Source 01/08/24 1103 Oral     SpO2 01/08/24 1103 98 %     Weight 01/08/24 1101 136 lb 3.9 oz (61.8 kg)     Height --      Head Circumference --      Peak Flow --      Pain Score 01/08/24 1100 0     Pain Loc --      Pain Education --      Exclude from Growth Chart --    No data found.  Updated Vital Signs BP 135/76 (BP Location: Right Arm)   Pulse 71   Temp 98.1 F (36.7 C) (Oral)    Resp 16   Wt 136 lb 3.9 oz (61.8 kg)   LMP 01/01/2024 (Approximate)   SpO2 98%   Breastfeeding No   BMI 21.99 kg/m   Visual Acuity Right Eye Distance:   Left Eye Distance:   Bilateral Distance:    Right Eye Near:   Left Eye Near:    Bilateral Near:     Physical Exam Vitals and nursing note reviewed.  Constitutional:      General: She is not in acute distress.    Appearance: Normal appearance. She is not ill-appearing, toxic-appearing or diaphoretic.  Eyes:     General: No scleral icterus. Cardiovascular:     Rate and Rhythm: Normal rate and regular rhythm.     Heart sounds: Normal heart sounds.  Pulmonary:     Effort: Pulmonary effort is normal. No respiratory distress.     Breath sounds: Normal breath sounds. No wheezing or rhonchi.  Abdominal:     General: Abdomen is flat. Bowel sounds are normal.     Palpations: Abdomen is soft.     Tenderness: There is no abdominal tenderness. There is no right CVA tenderness or left CVA tenderness.  Skin:    General: Skin is warm.  Neurological:     Mental Status: She is alert and oriented to person, place, and time.  Psychiatric:        Mood and Affect: Mood normal.        Behavior: Behavior normal.      UC Treatments / Results  Labs (all labs ordered are listed, but only abnormal results are displayed) Labs Reviewed  POCT URINE PREGNANCY - Normal  GLUCOSE, POCT (MANUAL RESULT ENTRY) - Normal  POCT URINE DIPSTICK    EKG   Radiology No results found.  Procedures Procedures (including critical [facility] time)  Medications Ordered in UC Medications - No data to display  Initial Impression / Assessment and Plan / UC Course  I have reviewed the triage vital signs and the nursing notes.  Pertinent labs & imaging results that were available during my [facility] of the patient were reviewed by me and considered in my medical decision making (see  chart for details).     Final Clinical Impressions(s) / UC Diagnoses   Final  diagnoses:  Polyuria     Discharge Instructions      Urinalysis is normal, pregnancy is negative, and blood sugars within normal limits.  I believe your frequent urination is due to the caffeine content and the sodas that you drink.  If you limit the caffeine contact you might urinate less.    ED Prescriptions   None    PDMP not reviewed this encounter.   Andra Corean BROCKS, PA-C 01/08/24 1206

## 2024-01-08 NOTE — Discharge Instructions (Signed)
 Urinalysis is normal, pregnancy is negative, and blood sugars within normal limits.  I believe your frequent urination is due to the caffeine content and the sodas that you drink.  If you limit the caffeine contact you might urinate less.

## 2024-01-08 NOTE — ED Triage Notes (Signed)
 Pt presents c/o frequent urination x 4 days. Pt states,  I am peeing a lot. Like no burning or nothing just frequent urination. So basically I just want to get checked. For everything. You can draw blood too.   Pt denies discharge or any additional sxs.

## 2024-02-18 ENCOUNTER — Inpatient Hospital Stay: Admit: 2024-02-18 | Discharge: 2024-02-18 | Attending: Family Medicine | Admitting: Family Medicine

## 2024-02-18 ENCOUNTER — Other Ambulatory Visit: Payer: Self-pay

## 2024-02-18 VITALS — BP 138/73 | HR 71 | Temp 98.6°F | Resp 18

## 2024-02-18 DIAGNOSIS — Z202 Contact with and (suspected) exposure to infections with a predominantly sexual mode of transmission: Secondary | ICD-10-CM | POA: Insufficient documentation

## 2024-02-18 NOTE — ED Triage Notes (Signed)
 Pt here requesting STD testing; pt denies sx or vaginal discharge

## 2024-02-18 NOTE — Discharge Instructions (Signed)
 Staff will notify you if there is anything positive on the swab on the blood work. It can take 2-3 days for the tests to result, depending on the day of the week your test was taken. You will only be notified if there are any positives on the testing; test results will also go to your MyChart if you are signed up for MyChart.

## 2024-02-18 NOTE — ED Provider Notes (Signed)
 [name]    CSN: [id number] Arrival date & time: 02/18/24  1106      History   Chief Complaint Chief Complaint  Patient presents with   SEXUALLY TRANSMITTED DISEASE    HPI Tracy Church is a 33 y.o. female.   HPI Here for possible exposure to STDs.  She does not have any vaginal discharge or itching or pelvic pain or dysuria.  NKDA  Last cycle was December 6.   Past Medical History:  Diagnosis Date   Medical history non-contributory    Preterm delivery     Patient Active Problem List   Diagnosis Date Noted   Retained products of conception following abortion 09/10/2023   Pregnancy 03/15/2018   History of preterm delivery 09/24/2017   History of prior pregnancy with short cervix, currently pregnant 09/24/2017    Past Surgical History:  Procedure Laterality Date   NO PAST SURGERIES      OB History     Gravida  3   Para  2   Term  1   Preterm  1   AB      Living  2      SAB      IAB      Ectopic      Multiple      Live Births  2            Home Medications    Prior to Admission medications  Medication Sig Start Date End Date Taking? Authorizing Provider  cyclobenzaprine  (FLEXERIL ) 10 MG tablet Take 1 tablet (10 mg total) by mouth every 8 (eight) hours as needed for muscle spasms. 09/10/23   Lola Donnice HERO, MD  dicyclomine  (BENTYL ) 20 MG tablet Take 20 mg by mouth. 04/08/23   [provider]  ferrous sulfate  325 (65 FE) MG EC tablet Take 1 tablet (325 mg total) by mouth every other day. 09/11/23 03/09/24  Lola Donnice HERO, MD  ibuprofen  (ADVIL ) 800 MG tablet Take by mouth. 07/15/23   [provider]  misoprostol  (CYTOTEC ) 200 MCG tablet Take 4 tablets (800 mcg total) by mouth once for 1 dose. 09/10/23 09/10/23  Lola Donnice HERO, MD  misoprostol  (CYTOTEC ) 200 MCG tablet Take 800 mcg by mouth. 09/10/23   [provider]  ondansetron  (ZOFRAN -ODT) 4 MG disintegrating tablet Dissolve 1 tablet  (4 mg total) in mouth every 6 (six) hours as needed for nausea. 10/01/23   Lola Donnice HERO, MD  oxyCODONE  (OXY IR/ROXICODONE ) 5 MG immediate release tablet Take 1 tablet (5 mg total) by mouth every 4 (four) hours as needed for severe pain (pain score 7-10) or breakthrough pain. 10/01/23   Lola Donnice HERO, MD  promethazine  (PHENERGAN ) 25 MG tablet Take by mouth. 07/15/23   [provider]  norethindrone  (MICRONOR ,CAMILA ,ERRIN ) 0.35 MG tablet Take 1 tablet (0.35 mg total) by mouth daily. Patient not taking: Reported on 05/17/2019 05/12/18 03/13/20  Stinson, Jacob J, DO    Family History Family History  Adopted: Yes  Problem Relation Age of Onset   Healthy Mother    Healthy Father     Social History Social History[1]   Allergies   Patient has no known allergies.   Review of Systems Review of Systems   Physical Exam Triage Vital Signs ED Triage Vitals  Encounter Vitals Group     BP 02/18/24 1146 138/73     Girls Systolic BP Percentile --      Girls Diastolic BP Percentile --  Boys Systolic BP Percentile --      Boys Diastolic BP Percentile --      Pulse Rate 02/18/24 1146 71     Resp 02/18/24 1146 18     Temp 02/18/24 1146 98.6 F (37 C)     Temp Source 02/18/24 1146 Oral     SpO2 02/18/24 1146 98 %     Weight --      Height --      Head Circumference --      Peak Flow --      Pain Score 02/18/24 1147 0     Pain Loc --      Pain Education --      Exclude from Growth Chart --    No data found.  Updated Vital Signs BP 138/73 (BP Location: Right Arm)   Pulse 71   Temp 98.6 F (37 C) (Oral)   Resp 18   LMP 02/08/2024 (Within Weeks)   SpO2 98%   Visual Acuity Right Eye Distance:   Left Eye Distance:   Bilateral Distance:    Right Eye Near:   Left Eye Near:    Bilateral Near:     Physical Exam Vitals reviewed.  Constitutional:      General: She is not in acute distress.    Appearance: She is not toxic-appearing.  Skin:    Coloration: Skin  is not pale.  Neurological:     Mental Status: She is alert and oriented to person, place, and time.  Psychiatric:        Behavior: Behavior normal.      UC Treatments / Results  Labs (all labs ordered are listed, but only abnormal results are displayed) Labs Reviewed  HIV ANTIBODY (ROUTINE TESTING W REFLEX)  SYPHILIS: RPR W/REFLEX TO RPR TITER AND TREPONEMAL ANTIBODIES, TRADITIONAL SCREENING AND DIAGNOSIS ALGORITHM  CERVICOVAGINAL ANCILLARY ONLY    EKG   Radiology No results found.  Procedures Procedures (including critical care time)  Medications Ordered in UC Medications - No data to display  Initial Impression / Assessment and Plan / UC Course  I have reviewed the triage vital signs and the nursing notes.  Pertinent labs & imaging results that were available during my care of the patient were reviewed by me and considered in my medical decision making (see chart for details).     Blood is drawn for HIV and RPR, and staff will notify them if any of that is positive  Vaginal self swab is done, and we will notify of any positives on that and treat per protocol.   Final Clinical Impressions(s) / UC Diagnoses   Final diagnoses:  Potential exposure to STD     Discharge Instructions      Staff will notify you if there is anything positive on the swab on the blood work. It can take 2-3 days for the tests to result, depending on the day of the week your test was taken. You will only be notified if there are any positives on the testing; test results will also go to your MyChart if you are signed up for MyChart.      ED Prescriptions   None    PDMP not reviewed this encounter.    [1]  Social History Tobacco Use   Smoking status: Some Days    Types: Cigars    Passive exposure: Yes   Smokeless tobacco: Never  Vaping Use   Vaping status: Former  Substance Use Topics   Alcohol use:  Not Currently   Drug use: Not Currently     Vonna Sharlet POUR,  MD 02/18/24 1200

## 2024-02-19 LAB — CERVICOVAGINAL ANCILLARY ONLY
Chlamydia: NEGATIVE
Comment: NEGATIVE
Comment: NEGATIVE
Comment: NORMAL
Neisseria Gonorrhea: NEGATIVE
Trichomonas: NEGATIVE

## 2024-02-19 LAB — HIV ANTIBODY (ROUTINE TESTING W REFLEX): HIV Screen 4th Generation wRfx: NONREACTIVE

## 2024-02-19 LAB — SYPHILIS: RPR W/REFLEX TO RPR TITER AND TREPONEMAL ANTIBODIES, TRADITIONAL SCREENING AND DIAGNOSIS ALGORITHM: RPR Ser Ql: NONREACTIVE
